# Patient Record
Sex: Female | Born: 1955 | Race: White | Hispanic: No | Marital: Married | Smoking: Current every day smoker
Health system: Southern US, Community
[De-identification: ages and names within clinical notes are randomized; demographics above are authoritative.]

## PROBLEM LIST (undated history)

## (undated) VITALS — BP 97/74 | HR 96 | Temp 97.5°F | Resp 16 | Ht 65.72 in | Wt 167.0 lb

## (undated) DIAGNOSIS — I350 Nonrheumatic aortic (valve) stenosis: Secondary | ICD-10-CM

## (undated) DIAGNOSIS — F32A Depression, unspecified: Secondary | ICD-10-CM

## (undated) DIAGNOSIS — J45909 Unspecified asthma, uncomplicated: Secondary | ICD-10-CM

## (undated) DIAGNOSIS — R519 Headache, unspecified: Secondary | ICD-10-CM

## (undated) DIAGNOSIS — D649 Anemia, unspecified: Secondary | ICD-10-CM

## (undated) DIAGNOSIS — K219 Gastro-esophageal reflux disease without esophagitis: Secondary | ICD-10-CM

## (undated) DIAGNOSIS — F1020 Alcohol dependence, uncomplicated: Secondary | ICD-10-CM

## (undated) DIAGNOSIS — I10 Essential (primary) hypertension: Secondary | ICD-10-CM

## (undated) DIAGNOSIS — F329 Major depressive disorder, single episode, unspecified: Secondary | ICD-10-CM

## (undated) DIAGNOSIS — F419 Anxiety disorder, unspecified: Secondary | ICD-10-CM

## (undated) DIAGNOSIS — R011 Cardiac murmur, unspecified: Secondary | ICD-10-CM

## (undated) DIAGNOSIS — M1990 Unspecified osteoarthritis, unspecified site: Secondary | ICD-10-CM

## (undated) HISTORY — DX: Unspecified osteoarthritis, unspecified site: M19.90

## (undated) HISTORY — PX: KNEE ARTHROSCOPY: SUR90

## (undated) HISTORY — PX: LAPAROSCOPIC HYSTERECTOMY: SHX1926

## (undated) HISTORY — DX: Unspecified asthma, uncomplicated: J45.909

## (undated) HISTORY — PX: GASTRIC BYPASS: SHX52

---

## 2001-07-30 ENCOUNTER — Encounter: Payer: Self-pay | Admitting: Surgery

## 2001-07-30 ENCOUNTER — Encounter: Admission: RE | Admit: 2001-07-30 | Discharge: 2001-07-30 | Payer: Self-pay | Admitting: Surgery

## 2001-08-03 ENCOUNTER — Encounter (INDEPENDENT_AMBULATORY_CARE_PROVIDER_SITE_OTHER): Payer: Self-pay | Admitting: Specialist

## 2001-08-03 ENCOUNTER — Ambulatory Visit (HOSPITAL_BASED_OUTPATIENT_CLINIC_OR_DEPARTMENT_OTHER): Admission: RE | Admit: 2001-08-03 | Discharge: 2001-08-04 | Payer: Self-pay | Admitting: Surgery

## 2002-11-02 ENCOUNTER — Encounter (INDEPENDENT_AMBULATORY_CARE_PROVIDER_SITE_OTHER): Payer: Self-pay | Admitting: *Deleted

## 2002-11-02 ENCOUNTER — Ambulatory Visit (HOSPITAL_COMMUNITY): Admission: RE | Admit: 2002-11-02 | Discharge: 2002-11-02 | Payer: Self-pay | Admitting: Obstetrics and Gynecology

## 2003-02-17 ENCOUNTER — Inpatient Hospital Stay (HOSPITAL_COMMUNITY): Admission: RE | Admit: 2003-02-17 | Discharge: 2003-02-19 | Payer: Self-pay | Admitting: Obstetrics and Gynecology

## 2003-02-17 ENCOUNTER — Encounter (INDEPENDENT_AMBULATORY_CARE_PROVIDER_SITE_OTHER): Payer: Self-pay | Admitting: Specialist

## 2003-12-05 ENCOUNTER — Other Ambulatory Visit: Admission: RE | Admit: 2003-12-05 | Discharge: 2003-12-05 | Payer: Self-pay | Admitting: Obstetrics and Gynecology

## 2004-01-10 ENCOUNTER — Emergency Department (HOSPITAL_COMMUNITY): Admission: EM | Admit: 2004-01-10 | Discharge: 2004-01-10 | Payer: Self-pay | Admitting: Emergency Medicine

## 2004-08-14 ENCOUNTER — Ambulatory Visit: Payer: Self-pay | Admitting: Internal Medicine

## 2004-09-13 ENCOUNTER — Ambulatory Visit: Payer: Self-pay | Admitting: Internal Medicine

## 2005-01-11 ENCOUNTER — Ambulatory Visit: Payer: Self-pay | Admitting: Internal Medicine

## 2005-03-28 ENCOUNTER — Inpatient Hospital Stay (HOSPITAL_COMMUNITY): Admission: AD | Admit: 2005-03-28 | Discharge: 2005-03-28 | Payer: Self-pay | Admitting: Neurology

## 2005-05-14 ENCOUNTER — Ambulatory Visit: Payer: Self-pay | Admitting: Internal Medicine

## 2005-07-26 ENCOUNTER — Encounter: Admission: RE | Admit: 2005-07-26 | Discharge: 2005-07-26 | Payer: Self-pay | Admitting: Family Medicine

## 2005-11-12 ENCOUNTER — Ambulatory Visit: Payer: Self-pay | Admitting: Internal Medicine

## 2006-08-05 ENCOUNTER — Inpatient Hospital Stay (HOSPITAL_COMMUNITY): Admission: EM | Admit: 2006-08-05 | Discharge: 2006-08-06 | Payer: Self-pay | Admitting: Emergency Medicine

## 2007-05-05 DIAGNOSIS — G4700 Insomnia, unspecified: Secondary | ICD-10-CM

## 2007-05-05 DIAGNOSIS — J209 Acute bronchitis, unspecified: Secondary | ICD-10-CM

## 2007-05-05 DIAGNOSIS — K219 Gastro-esophageal reflux disease without esophagitis: Secondary | ICD-10-CM

## 2007-05-05 DIAGNOSIS — J45909 Unspecified asthma, uncomplicated: Secondary | ICD-10-CM | POA: Insufficient documentation

## 2007-05-05 DIAGNOSIS — J309 Allergic rhinitis, unspecified: Secondary | ICD-10-CM | POA: Insufficient documentation

## 2007-05-06 ENCOUNTER — Ambulatory Visit: Payer: Self-pay | Admitting: Internal Medicine

## 2007-05-07 ENCOUNTER — Telehealth (INDEPENDENT_AMBULATORY_CARE_PROVIDER_SITE_OTHER): Payer: Self-pay | Admitting: *Deleted

## 2007-08-19 ENCOUNTER — Ambulatory Visit: Payer: Self-pay | Admitting: Internal Medicine

## 2007-12-07 ENCOUNTER — Encounter: Payer: Self-pay | Admitting: Internal Medicine

## 2007-12-07 ENCOUNTER — Telehealth (INDEPENDENT_AMBULATORY_CARE_PROVIDER_SITE_OTHER): Payer: Self-pay | Admitting: *Deleted

## 2007-12-15 ENCOUNTER — Ambulatory Visit: Payer: Self-pay | Admitting: Pulmonary Disease

## 2008-06-10 ENCOUNTER — Ambulatory Visit: Payer: Self-pay | Admitting: Internal Medicine

## 2008-07-07 ENCOUNTER — Encounter: Admission: RE | Admit: 2008-07-07 | Discharge: 2008-07-07 | Payer: Self-pay | Admitting: Surgery

## 2008-07-21 ENCOUNTER — Ambulatory Visit (HOSPITAL_COMMUNITY): Admission: RE | Admit: 2008-07-21 | Discharge: 2008-07-21 | Payer: Self-pay | Admitting: Surgery

## 2008-07-25 ENCOUNTER — Ambulatory Visit (HOSPITAL_COMMUNITY): Admission: RE | Admit: 2008-07-25 | Discharge: 2008-07-25 | Payer: Self-pay | Admitting: Obstetrics and Gynecology

## 2008-10-13 ENCOUNTER — Encounter (HOSPITAL_COMMUNITY): Admission: RE | Admit: 2008-10-13 | Discharge: 2008-12-08 | Payer: Self-pay | Admitting: Interventional Cardiology

## 2008-11-10 ENCOUNTER — Encounter: Admission: RE | Admit: 2008-11-10 | Discharge: 2009-02-08 | Payer: Self-pay | Admitting: Surgery

## 2008-11-16 ENCOUNTER — Encounter: Payer: Self-pay | Admitting: Internal Medicine

## 2008-11-29 ENCOUNTER — Inpatient Hospital Stay (HOSPITAL_COMMUNITY): Admission: RE | Admit: 2008-11-29 | Discharge: 2008-12-02 | Payer: Self-pay | Admitting: Surgery

## 2008-11-30 ENCOUNTER — Ambulatory Visit: Payer: Self-pay | Admitting: Vascular Surgery

## 2008-11-30 ENCOUNTER — Encounter (INDEPENDENT_AMBULATORY_CARE_PROVIDER_SITE_OTHER): Payer: Self-pay | Admitting: Surgery

## 2008-12-21 ENCOUNTER — Encounter: Payer: Self-pay | Admitting: Internal Medicine

## 2009-06-09 ENCOUNTER — Ambulatory Visit: Payer: Self-pay | Admitting: Internal Medicine

## 2009-06-28 ENCOUNTER — Ambulatory Visit (HOSPITAL_COMMUNITY): Admission: RE | Admit: 2009-06-28 | Discharge: 2009-06-28 | Payer: Self-pay | Admitting: Family Medicine

## 2009-11-30 ENCOUNTER — Ambulatory Visit: Payer: Self-pay | Admitting: Internal Medicine

## 2009-11-30 DIAGNOSIS — R0789 Other chest pain: Secondary | ICD-10-CM | POA: Insufficient documentation

## 2010-05-08 NOTE — Assessment & Plan Note (Signed)
Summary: 12 months/apc   Visit Type:  12 month visit Primary Provider/Referring Provider:  Dawayne Patricia  CC:  no complaints.  History of Present Illness: 08/19/07- she asked to be worked in for an acute visit today.  She kept her grandson and caught a cold.  Green nasal discharge.  Has not felt well.  Chest feels tight.  Head is congested, and she's wheezing.  Feels hot but has not had fever or sore throat. 12/15/07- 55 year old female with known history of asthma, GERD and Diabetes.  December 17, 2007 - Complains over last 10 days of cough, congestion, thick mucus, wheeizng and nasal congestion. Denies chest pain, dyspnea, orthopnea, hemoptysis, fever, n/v/d, edema.  Took some left over amoxicillin for  days. not much help.   06/10/08- Asthma, allergic rhinitis Aware lisinopril might cause cough- not having a problem. She is considering bariatric surgery. No active respiratory difficulty. Reviewed meds.  June 09, 2009- Asthma, allergic rhinitis, Bariatric surgery 2010 Continues weight loss after gastric bypass bariatric surgery 11/29/08. More comfortable. Had a minor cold but resolving with otc meds. Had flu vax.     Current Medications (verified): 1)  Prilosec 20 Mg  Cpdr (Omeprazole) .... Take 1 Capsule By Mouth Once A Day 2)  Lisinopril 20 Mg Tabs (Lisinopril) .... Once Daily 3)  Aleve 220 Mg  Tabs (Naproxen Sodium) .... Taking Up To 6 Per Day 4)  Prozac 40 Mg  Caps (Fluoxetine Hcl) .... Take 1 Capsule By Mouth Once A Day 5)  Advair Diskus 100-50 Mcg/dose  Misc (Fluticasone-Salmeterol) .... Inhale 1 Puff Two Times A Day 6)  Ventolin Hfa 108 (90 Base) Mcg/act Aers (Albuterol Sulfate) .... 2 Puffs Four Times A Day As Needed 7)  Crestor 10 Mg Tabs (Rosuvastatin Calcium) .Marland Kitchen.. 1 Daily  Allergies (verified): 1)  ! 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 Married Patient states former smoker x 25 years  Review of Systems      See HPI  The patient denies anorexia, fever, weight loss, weight gain, vision loss, decreased hearing, hoarseness, chest pain, syncope, dyspnea on exertion, peripheral edema, prolonged cough, headaches, hemoptysis, abdominal pain, and severe indigestion/heartburn.    Vital Signs:  Patient profile:   55 year old female Height:      56 inches Weight:      192.25 pounds BMI:     43.26 O2 Sat:      86 % on Room air Pulse rate:   71 / minute BP sitting:   110 / 68  (left arm) Cuff size:   regular  Vitals Entered By: Clarise Cruz Duncan Dull) (June 09, 2009 10:11 AM)  O2 Flow:  Room air  Physical Exam  Additional Exam:  General: A/Ox3; pleasant and cooperative, NAD, talkative and energetic, very obese SKIN: no rash, lesions NODES: no lymphadenopathy HEENT: [place]/AT, EOM- WNL, Conjuctivae- clear, PERRLA,  TM-WNL, Nose- clear, Throat- clear and wnl, Mallampati  II NECK: Supple w/ fair ROM, JVD- none, normal carotid impulses w/o bruits Thyroid-  CHEST: Clear to P&A, mild inspiratory drag HEART: RRR, no m/g/r heard ABDOMEN:  WUJ:WJXB, nl pulses, no edema  NEURO: Grossly intact to observation      Impression & Recommendations:  Problem # 1:  ASTHMA (ICD-493.90) Excellent control. Weight loss has helped.  Problem # 2:  ALLERGIC  RHINITIS (ICD-477.9)  Good control.  Medications Added to Medication List This Visit: 1)  Lisinopril 20 Mg Tabs (Lisinopril) .... Once daily  Other Orders: Est. Patient Level III (14782)  Patient Instructions: 1)  Schedule return in one year, earlier if needed Prescriptions: VENTOLIN HFA 108 (90 BASE) MCG/ACT AERS (ALBUTEROL SULFATE) 2 puffs four times a day as needed  #3 x 3   Entered and Authorized by:   Waymon Budge MD   Signed by:   Waymon Budge MD on 06/09/2009   Method used:   Print then Give to Patient   RxID:   [id number] ADVAIR DISKUS 100-50 MCG/DOSE  MISC (FLUTICASONE-SALMETEROL) Inhale 1 puff two times a day  #3 x 3   Entered and Authorized by:   Waymon Budge MD   Signed by:   Waymon Budge MD on 06/09/2009   Method used:   Print then Give to Patient   RxID:   [id number]

## 2010-05-08 NOTE — Assessment & Plan Note (Signed)
Summary: rov/ mbw   Primary Provider/Referring Provider:  Dawayne Patricia  CC:  Folloew up visit-recent Bronchitis-not gotten any better-pain when inhaling.Marland Kitchen  History of Present Illness:  History of Present Illness: 08/19/07- she asked to be worked in for an acute visit today.  She kept her grandson and caught a cold.  Green nasal discharge.  Has not felt well.  Chest feels tight.  Head is congested, and she's wheezing.  Feels hot but has not had fever or sore throat. 12/15/07- 55 year old female with known history of asthma, GERD and Diabetes.  December 17, 2007 - Complains over last 10 days of cough, congestion, thick mucus, wheeizng and nasal congestion. Denies chest pain, dyspnea, orthopnea, hemoptysis, fever, n/v/d, edema.  Took some left over amoxicillin for  days. not much help.   06/10/08- Asthma, allergic rhinitis Aware lisinopril might cause cough- not having a problem. She is considering bariatric surgery. No active respiratory difficulty. Reviewed meds.  June 09, 2009- Asthma, allergic rhinitis, Bariatric surgery 2010 Continues weight loss after gastric bypass bariatric surgery 11/29/08. More comfortable. Had a minor cold but resolving with otc meds. Had flu vax.   November 30, 2009- Asthma, allergic rhinits, Bariatric surgery2010 Had a cold end of July, with chills. Went to her primary for scheduled PHEX August 3. Was dx'd bronchitis and given doxy and pred. Sputum was green. Didn't cpmletely clear. Coughing still with light green sputum. For past week has had a pleuritic right upper parasternal chest pain. Using rescue inhaler a lot, but just because she has it - it isn't doing much for her.   Asthma History    Initial Asthma Severity Rating:    Age range: 12+ years    Symptoms: 0-2 days/week    Nighttime Awakenings: 0-2/month    Interferes w/ normal activity: no limitations    SABA use (not for EIB): several times per day    Asthma Severity Assessment: Severe  Persistent   Preventive Screening-Counseling & Management  Alcohol-Tobacco     Smoking Status: quit     Year Quit: 1999     Pack years: socially  Current Medications (verified): 1)  Prilosec 20 Mg  Cpdr (Omeprazole) .... Take 1 Capsule By Mouth Once A Day 2)  Lisinopril 20 Mg Tabs (Lisinopril) .... Once Daily 3)  Aleve 220 Mg  Tabs (Naproxen Sodium) .... Taking Up To 6 Per Day 4)  Prozac 40 Mg  Caps (Fluoxetine Hcl) .... Take 1 Capsule By Mouth Once A Day 5)  Advair Diskus 100-50 Mcg/dose  Misc (Fluticasone-Salmeterol) .... Inhale 1 Puff Two Times A Day 6)  Ventolin Hfa 108 (90 Base) Mcg/act Aers (Albuterol Sulfate) .... 2 Puffs Four Times A Day As Needed 7)  Crestor 10 Mg Tabs (Rosuvastatin Calcium) .Marland Kitchen.. 1 Daily  Allergies: 1)  ! Morphine 2)  ! 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  The patient denies shortness of breath with activity, shortness of breath at rest, coughing up blood, irregular heartbeats, acid heartburn, indigestion, loss of appetite, weight change, abdominal pain, difficulty swallowing, sore throat, tooth/dental problems, headaches, nasal congestion/difficulty breathing through nose, and sneezing.    Vital Signs:  Patient profile:   55 year old  female Height:      56 inches Weight:      185.50 pounds BMI:     41.74 O2 Sat:      97 % on Room air Pulse rate:   80 / minute BP sitting:   110 / 74  (left arm) Cuff size:   regular  Vitals Entered By: Reynaldo Minium CMA (November 30, 2009 9:22 AM)  O2 Flow:  Room air CC: Folloew up visit-recent Bronchitis-not gotten any better-pain when inhaling.   Physical Exam  Additional Exam:  General: A/Ox3; pleasant and cooperative, NAD, talkative and energetic,  obese but has lost weight SKIN: no rash, lesions NODES: no lymphadenopathy HEENT: [place]/AT, EOM- WNL, Conjuctivae- clear, PERRLA, TM-WNL, Nose- clear, Throat- clear and wnl, Mallampati  II NECK: Supple w/ fair ROM, JVD- none, normal carotid impulses w/o bruits Thyroid-  CHEST: Wheeze bilaterally HEART: RRR, no m/g/r heard ABDOMEN:  LKG:MWNU, nl pulses, no edema  NEURO: Grossly intact to observation      Impression & Recommendations:  Problem # 1:  ASTHMA (ICD-493.90) Exacerbation of an asthnmatic bronchitis that began as an infection.  Now has musculoskeletal chest wall pain  c/w costochondiritis. We will get CXR because of the persistent cpough, but re-treat with pred and antibiotic, neb and depo.  Problem # 2:  CHEST PAIN, ATYPICAL (ICD-786.59) Discussion as above.  Medications Added to Medication List This Visit: 1)  Amoxicillin-pot Clavulanate 500-125 Mg Tabs (Amoxicillin-pot clavulanate) .Marland Kitchen.. 1 two times a day 2)  Prednisone 10 Mg Tabs (Prednisone) .Marland Kitchen.. 1 tab four times daily x 2 days, 3 times daily x 2 days, 2 times daily x 2 days, 1 time daily x 2 days  Other Orders: Est. Patient Level III (99213) T-2 View CXR (71020TC) Nebulizer Tx (27253) Depo- Medrol 80mg  (J1040) Admin of Therapeutic Inj  intramuscular or subcutaneous (66440)  Patient Instructions: 1)  Please schedule a follow-up appointment in 3 months. 2)  Neb xop 1.25 3)  depo80 4)  Scripts for antibiotic and prednisone taper sent to drug store 5)  A  chest x-ray has been recommended.  Your imaging study may require preauthorization.  Prescriptions: PREDNISONE 10 MG TABS (PREDNISONE) 1 tab four times daily x 2 days, 3 times daily x 2 days, 2 times daily x 2 days, 1 time daily x 2 days  #20 x 0   Entered and Authorized by:   Waymon Budge MD   Signed by:   Waymon Budge MD on 11/30/2009   Method used:   Electronically to        [facility] [address] (retail)       [address]       [place], [place]  [address]       Ph: [phone] or [phone]       Fax: [phone]   RxID:   [id number] AMOXICILLIN-POT CLAVULANATE 500-125 MG TABS (AMOXICILLIN-POT CLAVULANATE) 1 two times a day  #14 x 0   Entered and Authorized by:   Waymon Budge MD   Signed by:   Joni Fears  Magda Kiel MD on 11/30/2009   Method used:   Electronically to        [facility] [address] (retail)       [address]       [place], [place]  [address]       Ph: [phone] or [phone]       Fax: [phone]   RxID:   [id number]      Medication Administration  Injection # 1:    Medication: Depo- Medrol 80mg     Diagnosis: ASTHMA (ICD-493.90)    Route: IM    Site: LUOQ gluteus    Exp Date: 07-2012    Lot #: [id number]    Mfr: Pharmacia    Patient tolerated injection without complications    Given by: Elray Buba RN (November 30, 2009 10:28 AM)  Medication # 1:    Medication: Xopenex 1.25mg     Diagnosis: ASTHMA (ICD-493.90)    Dose: 1 VIAL    Route: inhaled    Exp Date: 09-11    Lot #: [id number]    Mfr: SEPRACOR    Patient tolerated medication without complications    Given by: Elray Buba RN (November 30, 2009 10:26 AM)  Orders Added: 1)  Est. Patient Level III [15176] 2)  T-2 View CXR [71020TC] 3)  Nebulizer Tx [16073] 4)  Depo- Medrol 80mg  [J1040] 5)  Admin of Therapeutic Inj  intramuscular or subcutaneous [71062]

## 2010-06-08 ENCOUNTER — Ambulatory Visit (INDEPENDENT_AMBULATORY_CARE_PROVIDER_SITE_OTHER): Payer: 59 | Admitting: Internal Medicine

## 2010-06-08 ENCOUNTER — Encounter: Payer: Self-pay | Admitting: Internal Medicine

## 2010-06-08 DIAGNOSIS — J45909 Unspecified asthma, uncomplicated: Secondary | ICD-10-CM

## 2010-06-08 DIAGNOSIS — J309 Allergic rhinitis, unspecified: Secondary | ICD-10-CM

## 2010-06-11 ENCOUNTER — Other Ambulatory Visit (HOSPITAL_COMMUNITY): Payer: Self-pay | Admitting: Family Medicine

## 2010-06-11 DIAGNOSIS — Z139 Encounter for screening, unspecified: Secondary | ICD-10-CM

## 2010-06-19 NOTE — Assessment & Plan Note (Signed)
Summary: 1 year follow up   Primary Provider/Referring Provider:  Dawayne Patricia  CC:  Follow up visit-asthma and allergies..  History of Present Illness: June 09, 2009- Asthma, allergic rhinitis, Bariatric surgery 2010 Continues weight loss after gastric bypass bariatric surgery 11/29/08. More comfortable. Had a minor cold but resolving with otc meds. Had flu vax.   November 30, 2009- Asthma, allergic rhinits, Bariatric surgery2010 Had a cold end of July, with chills. Went to her primary for scheduled PHEX August 3. Was dx'd bronchitis and given doxy and pred. Sputum was green. Didn't cpmletely clear. Coughing still with light green sputum. For past week has had a pleuritic right upper parasternal chest pain. Using rescue inhaler a lot, but just because she has it - it isn't doing much for her.  June 08, 2010- Asthma, allergic rhinits, Bariatric surgery2010 Follow up visit-asthma and allergies. Had a bronchits mostly resolved with otc meds. Residual chest congetion, non productive. Dyspnea is apropriate to level of exertion. Breathing doesn' t wake her. Advair two times a day. Uses ventolin once daily "habit rather than need". Expects to be worst in Spring and Fall.    Asthma History    Asthma Control Assessment:    Age range: 12+ years    Symptoms: 0-2 days/week    Nighttime Awakenings: 0-2/month    Interferes w/ normal activity: no limitations    SABA use (not for EIB): 0-2 days/week    Asthma Control Assessment: Well Controlled   Preventive Screening-Counseling & Management  Alcohol-Tobacco     Smoking Status: quit     Year Quit: 1999     Pack years: socially  Current Medications (verified): 1)  Prilosec 20 Mg  Cpdr (Omeprazole) .... Take 1 Capsule By Mouth Once A Day 2)  Lisinopril 20 Mg Tabs (Lisinopril) .... Once Daily 3)  Aleve 220 Mg  Tabs (Naproxen Sodium) .... Taking Up To 6 Per Day 4)  Prozac 40 Mg  Caps (Fluoxetine Hcl) .... Take 1 Capsule By Mouth Once A Day 5)   Advair Diskus 100-50 Mcg/dose  Misc (Fluticasone-Salmeterol) .... Inhale 1 Puff Two Times A Day 6)  Ventolin Hfa 108 (90 Base) Mcg/act Aers (Albuterol Sulfate) .... 2 Puffs Four Times A Day As Needed 7)  Crestor 10 Mg Tabs (Rosuvastatin Calcium) .Marland Kitchen.. 1 Daily 8)  Vitamin D3 2000 Unit Caps (Cholecalciferol) .... Take 1 Poq D 9)  Ergocalcium 100,000 .... Take Twice Weekly  Allergies (verified): 1)  ! Morphine 2)  ! 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  The patient denies shortness of breath at rest, productive cough, non-productive cough, coughing up blood, chest pain, irregular heartbeats, acid heartburn, indigestion, loss of appetite, weight change, abdominal pain, difficulty swallowing, sore throat, tooth/dental problems, headaches, nasal congestion/difficulty breathing through nose, and sneezing.    Vital Signs:  Patient profile:   55 year old female Height:      56 inches Weight:      184 pounds BMI:     41.40 O2 Sat:      94 % on Room air Pulse rate:   79 / minute BP sitting:   138 / 80  (left arm) Cuff size:   regular  Vitals Entered By: Randell Loop CMA (June 08, 2010 9:57 AM)  O2 Flow:  Room air CC: Follow up visit-asthma and allergies.   Physical  Exam  Additional Exam:  General: A/Ox3; pleasant and cooperative, NAD, talkative and energetic,  obese but has lost weight SKIN: no rash, lesions NODES: no lymphadenopathy HEENT: [place]/AT, EOM- WNL, Conjuctivae- clear, PERRLA, TM-WNL, Nose- clear, Throat- clear and wnl, Mallampati  II NECK: Supple w/ fair ROM, JVD- none, normal carotid impulses w/o bruits Thyroid-  CHEST: Wheeze bilaterally, especially left upper zone.  HEART: RRR, no m/g/r heard ABDOMEN:  ZOX:WRUE, nl pulses, no edema  NEURO: Grossly intact to observation      Impression & Recommendations:  Problem # 1:  ASTHMA (ICD-493.90) Insufficient control. Part of the problem is that she chooses to ignore symptoms. Education done.   Plan- we will move back up to Advair 250 to see if this will stabilize her going into bad season.   Problem # 2:  ESOPHAGEAL REFLUX (ICD-530.81) We reviewed status, with discussion of interaction between reflux and asthma symptoms as a possibility.  Her updated medication list for this problem includes:    Prilosec 20 Mg Cpdr (Omeprazole) .Marland Kitchen... Take 1 capsule by mouth once a day  Problem # 3:  ALLERGIC RHINITIS (ICD-477.9) not yet notincing much discomfort with early pollen season.   Medications Added to Medication List This Visit: 1)  Advair Diskus 250-50 Mcg/dose Aepb (Fluticasone-salmeterol) .Marland Kitchen.. 1 puff and rise two times a day 2)  Vitamin D3 2000 Unit Caps (Cholecalciferol) .... Take 1 poq d 3)  Ergocalcium 100,000  .... Take twice weekly  Other Orders: Est. Patient Level III (45409)  Patient Instructions: 1)  Please schedule a follow-up appointment in 6 months. 2)  Script so you can go up on Advair for the Spring to Advair 250. We can reassess as needed.  Prescriptions: ADVAIR DISKUS 250-50 MCG/DOSE AEPB (FLUTICASONE-SALMETEROL) 1 puff and rise two times a day  #1 x prn    Entered and Authorized by:   Waymon Budge MD   Signed by:   Waymon Budge MD on 06/08/2010   Method used:    Print then Give to Patient   RxID:   [id number]     Appended Document: 1 year follow up-SEND RX TO [facility] Pt requested Rx for Advair 250/50 be sent to [facility]. I have sent Rx as requested.Reynaldo Minium CMA  June 12, 2010 5:13 PM    Clinical Lists Changes  Medications: Rx of ADVAIR DISKUS 250-50 MCG/DOSE AEPB (FLUTICASONE-SALMETEROL) 1 puff and rise two times a day;  #3 x 3;  Signed;  Entered by: Reynaldo Minium CMA;  Authorized by: Waymon Budge MD;  Method used: Electronically to [facility] [address], [place], [place]  [address], Ph: [phone], Fax: [phone]    Prescriptions: ADVAIR DISKUS 250-50 MCG/DOSE AEPB (FLUTICASONE-SALMETEROL) 1 puff and rise two times a day  #3 x 3   Entered by:   Reynaldo Minium CMA   Authorized by:   Waymon Budge MD   Signed by:  Reynaldo Minium CMA on 06/12/2010   Method used:   Electronically to        [facility]* (mail-order)       [address]       [place], [place]  [address]       Ph: [phone]       Fax: [phone]   RxID:   [id number]

## 2010-07-14 LAB — DIFFERENTIAL
Basophils Absolute: 0.1 10*3/uL (ref 0.0–0.1)
Basophils Relative: 1 % (ref 0–1)
Basophils Relative: 1 % (ref 0–1)
Eosinophils Absolute: 0 10*3/uL (ref 0.0–0.7)
Eosinophils Absolute: 0.1 10*3/uL (ref 0.0–0.7)
Eosinophils Absolute: 0.1 10*3/uL (ref 0.0–0.7)
Eosinophils Relative: 0 % (ref 0–5)
Eosinophils Relative: 1 % (ref 0–5)
Eosinophils Relative: 2 % (ref 0–5)
Lymphocytes Relative: 10 % — ABNORMAL LOW (ref 12–46)
Lymphocytes Relative: 13 % (ref 12–46)
Lymphocytes Relative: 20 % (ref 12–46)
Lymphs Abs: 1.1 10*3/uL (ref 0.7–4.0)
Lymphs Abs: 1.1 10*3/uL (ref 0.7–4.0)
Monocytes Absolute: 0.3 10*3/uL (ref 0.1–1.0)
Monocytes Absolute: 0.6 10*3/uL (ref 0.1–1.0)
Monocytes Relative: 6 % (ref 3–12)
Monocytes Relative: 7 % (ref 3–12)
Monocytes Relative: 8 % (ref 3–12)
Neutro Abs: 3.9 10*3/uL (ref 1.7–7.7)
Neutro Abs: 6.4 10*3/uL (ref 1.7–7.7)

## 2010-07-14 LAB — COMPREHENSIVE METABOLIC PANEL
AST: 27 U/L (ref 0–37)
Albumin: 4.1 g/dL (ref 3.5–5.2)
BUN: 15 mg/dL (ref 6–23)
Calcium: 9.6 mg/dL (ref 8.4–10.5)
Chloride: 102 mEq/L (ref 96–112)
Creatinine, Ser: 0.6 mg/dL (ref 0.4–1.2)
GFR calc Af Amer: >60 mL/min (ref >60)
Total Bilirubin: 0.5 mg/dL (ref 0.3–1.2)
Total Protein: 7.1 g/dL (ref 6.0–8.3)

## 2010-07-14 LAB — CBC
HCT: 33 % — ABNORMAL LOW (ref 36.0–46.0)
HCT: 33.9 % — ABNORMAL LOW (ref 36.0–46.0)
Hemoglobin: 11 g/dL — ABNORMAL LOW (ref 12.0–15.0)
MCHC: 34.7 g/dL (ref 30.0–36.0)
MCHC: 34.9 g/dL (ref 30.0–36.0)
MCV: 91.2 fL (ref 78.0–100.0)
MCV: 92 fL (ref 78.0–100.0)
MCV: 92 fL (ref 78.0–100.0)
Platelets: 123 10*3/uL — ABNORMAL LOW (ref 150–400)
Platelets: 127 10*3/uL — ABNORMAL LOW (ref 150–400)
Platelets: 161 10*3/uL (ref 150–400)
RBC: 3.44 MIL/uL — ABNORMAL LOW (ref 3.87–5.11)
RBC: 3.72 MIL/uL — ABNORMAL LOW (ref 3.87–5.11)
RDW: 14.2 % (ref 11.5–15.5)
RDW: 14.5 % (ref 11.5–15.5)
WBC: 5.4 10*3/uL (ref 4.0–10.5)
WBC: 8.4 10*3/uL (ref 4.0–10.5)

## 2010-07-14 LAB — HEMOGLOBIN AND HEMATOCRIT, BLOOD
HCT: 34.1 % — ABNORMAL LOW (ref 36.0–46.0)
Hemoglobin: 11.6 g/dL — ABNORMAL LOW (ref 12.0–15.0)

## 2010-07-14 LAB — GLUCOSE, CAPILLARY
Glucose-Capillary: 107 mg/dL — ABNORMAL HIGH (ref 70–99)
Glucose-Capillary: 107 mg/dL — ABNORMAL HIGH (ref 70–99)
Glucose-Capillary: 109 mg/dL — ABNORMAL HIGH (ref 70–99)
Glucose-Capillary: 111 mg/dL — ABNORMAL HIGH (ref 70–99)
Glucose-Capillary: 111 mg/dL — ABNORMAL HIGH (ref 70–99)
Glucose-Capillary: 117 mg/dL — ABNORMAL HIGH (ref 70–99)
Glucose-Capillary: 120 mg/dL — ABNORMAL HIGH (ref 70–99)
Glucose-Capillary: 132 mg/dL — ABNORMAL HIGH (ref 70–99)
Glucose-Capillary: 138 mg/dL — ABNORMAL HIGH (ref 70–99)
Glucose-Capillary: 154 mg/dL — ABNORMAL HIGH (ref 70–99)
Glucose-Capillary: 163 mg/dL — ABNORMAL HIGH (ref 70–99)
Glucose-Capillary: 189 mg/dL — ABNORMAL HIGH (ref 70–99)
Glucose-Capillary: 92 mg/dL (ref 70–99)

## 2010-07-26 ENCOUNTER — Ambulatory Visit (HOSPITAL_COMMUNITY): Payer: 59

## 2010-08-21 NOTE — Discharge Summary (Signed)
[name], [name]            ACCOUNT NO.:  [id number]   MEDICAL RECORD NO.:  [id number]          PATIENT TYPE:  INP   LOCATION:  1527                         FACILITY:  [facility]   PHYSICIAN:  Sandria Bales. Ezzard Standing, M.D.  DATE OF BIRTH:  [birth year]   DATE OF ADMISSION:  11/29/2008  DATE OF DISCHARGE:  12/02/2008                               DISCHARGE SUMMARY   Dates of admission/discharge?   DISCHARGE DIAGNOSES:  1. Morbid obesity with a weight of 253, BMI of 40.7.  2. Hypertension.  3. Hypercholesterolemia.  4. Non-insulin-dependent diabetes mellitus.  5. Seasonal asthma.  6. History of gastroesophageal reflux disease.  7. Depression, on Prozac.  8. Osteoarthritis of knees.  9. Transient thrombocytopenia, resolved.   OPERATIONS PERFORMED:  The patient had a laparoscopic Roux-en-Y gastric  bypass and upper endoscopy on November 29, 2008.   HISTORY OF ILLNESS:  [name] is a 55 year old white female who sees  Dr. Gweneth Dimitri, as her primary care doctor, and Dr. Jetty Duhamel  from a pulmonary standpoint, has been overweight much of her adult life  and tried multiple diets without success.  She has been through our  bariatric program and comes to [facility] with plans for a  Roux-en-Y gastric bypass.   SIGNIFICANT PAST HISTORY:  1. She has hypertension.  2. She has hypercholesterolemia.  3. She has seasonal asthma.  4. She has gastroesophageal reflux disease.  5. She has a history of depression, on Prozac.  6. She has osteoarthritis, takes nonsteroidal antiinflammatory drugs.      A long discussion was carried out about the risk of these postop      bypass and she understands she may need to modify the use of      nonsteroidals for her arthritis postop.  7. She has non-insulin-dependent diabetes mellitus, on oral      hypoglycemics.  8. Prior history of a umbilical hernia per Dr. Gerrit Friends in 2003 with      mesh.   On the day of admission, she was taken to the  operating room where she  underwent a laparoscopic Roux-en-Y gastric bypass and upper endoscopy.  Postop she did well.   She was fairly sore on the first postoperative day, her hemoglobin was  12, white blood count of 8004 and platelets were 127,000.  Her blood  sugars were stable.  Dopplers of her lower extremities were negative and  her UGI swallow was okay.   On the second postoperative day, her white blood count was 8100,  platelets had dropped to 100,000.  She was on subcu heparin for DVT  prophylaxis, I left her on the subcu heparin despite the platelet  decrease.  She had a moderate amount of bruising around her incisions  but otherwise seemed to be doing well but she does seem to have some gas  discomfort on the second postop day and was not really ready to go home.   On the third day, she is afebrile.  Her platelets count improved to  123,000.  She is feeling better and ready for discharge.   DISCHARGE INSTRUCTIONS:  I  reviewed her medicines with her.  1. She is going to hold her diabetes medicines, her Glucophage and      Amaryl, and check her blood sugars and add these back as necessary.      I have also instructed her to get in touch with Dr. Gweneth Dimitri      to follow up her medicines within 2-4 weeks post discharge.  2. She will continue lisinopril.  3. She will continue Prozac 40 mg daily.  4. She will continue Crestor 20 mg daily.  5. Prilosec, she can decide to use or not depending on what kind of      symptoms she has.  6. She is on Xanax 0.25 mg p.r.n.  7. Advair Diskus 10/50 that she will use twice a day.  8. She has albuterol inhaler p.r.n.  9. She has Aleve, I have told her to hold until she is back in the      office again.  I warned her about the risk of ulceration with      nonsteroidal antiinflammatory drugs.   She is given Roxicet elixir for pain.  She is to see the dietician in 2  weeks.  She will be on her post Roux-en-Y gastric bypass which  includes a high  protein liquid diet and clear liquids, and then she will see me back in  2-3 weeks for follow up.  Her discharge condition is good.      Sandria Bales. Ezzard Standing, M.D.  Electronically Signed     DHN/MEDQ  D:  12/02/2008  T:  12/02/2008  Job:  [id number]   cc:   Joni Fears D. Maple Hudson, MD, FCCP, FACP  Pam Drown, M.D.

## 2010-08-21 NOTE — Op Note (Signed)
[name], [name]            ACCOUNT NO.:  [id number]   MEDICAL RECORD NO.:  [id number]          PATIENT TYPE:  INP   LOCATION:  1527                         FACILITY:  [facility]   PHYSICIAN:  Sharlet Salina T. Hoxworth, M.D.DATE OF BIRTH:  [birth year]   DATE OF PROCEDURE:  11/29/2008  DATE OF DISCHARGE:                               OPERATIVE REPORT   PROCEDURE:  Upper GI endoscopy.   DESCRIPTION OF PROCEDURE:  Upper GI endoscopy is performed at the  completion of Roux-en-Y gastric bypass by Dr. Ovidio Kin.  The video  endoscope was inserted into the upper esophagus and passed under direct  vision to the EG junction.  The small gastric pouch was entered and then  tensely distended with air.  With the outlet clamped by Dr. Ezzard Standing under  saline irrigation there was no evidence of leak.  The anastomosis was  visualized and was patent.  The suture and staple lines appeared intact  and without bleeding.  The gastric mucosa was normal.  The pouch  measured 5 cm in length.  Following this the pouch was desufflated and  the scope withdrawn.      Lorne Skeens. Hoxworth, M.D.  Electronically Signed     BTH/MEDQ  D:  11/29/2008  T:  11/29/2008  Job:  [id number]

## 2010-08-21 NOTE — Op Note (Signed)
[name], [name]            ACCOUNT NO.:  [id number]   MEDICAL RECORD NO.:  [id number]          PATIENT TYPE:  INP   LOCATION:  0002                         FACILITY:  [facility]   PHYSICIAN:  Sandria Bales. Ezzard Standing, M.D.  DATE OF BIRTH:  25-Mar-1956   DATE OF PROCEDURE:  11/29/2008  DATE OF DISCHARGE:                               OPERATIVE REPORT   Date of surgery ?   PREOPERATIVE DIAGNOSIS:  Morbid obesity, weight of 253, body mass index  of 40.7.   POSTOPERATIVE DIAGNOSIS:  Morbid obesity, weight of 253, body mass index  of 40.7.   PROCEDURE:  Laparoscopic Roux-en-Y gastric (antecolic, antegastric),  upper endoscopy.   SURGEON:  Dr.  Ezzard Standing.   FIRST ASSISTANT:  Dr. Jaclynn Guarneri.   ANESTHESIA:  General endotracheal.   ESTIMATED BLOOD LOSS:  Minimal.   HISTORY AND PROCEDURE:  [name] is a 55 year old white female who  has completed our bariatric preoperative program and is interested in  laparoscopic Roux-en-Y gastric bypass.   The indications, benefits and complications of procedure explained to  the patient.  Potential complications include, but are not limited to,  bleeding, infection, leakage from the bowel, deep venous thrombosis with  possible from pulmonary embolism, the possibility open surgery, and long-  term nutritional consequences.   OPERATIVE NOTE:  The patient was placed in a supine position and given a  general endotracheal anesthetic.  She was given 2 grams of cefoxitin at  the initiation of the procedure.  Her abdomen was prepped with CHG and  sterilely draped.  A time-out was held identifying the patient and  procedure and the surgical check list run.   I accessed the abdominal cavity through the left upper quadrant using a  11-mm Ethicon OptiView trocar and entered the abdominal cavity without  difficulty.   I then carried out laparoscopic exploration and the left lobe of the  liver was unremarkable.  The anterior wall of the stomach was  unremarkable.  The patient did have adhesions around her umbilical area  where she had a prior hernia repair.  There was no other mass or nodule  within her abdomen.  I ended up placing six additional trocars, a 5-mm  subxiphoid trocar for the liver retractor, a 12 mm right subcostal  trocar, a 12-mm right paramedian trocar, an 11 mm trocar to the right  lateral aspect of the umbilicus, a left paramedian trocar and then a 5  mm left lateral trocar.   I first took down the omentum.  This took only about 10 minutes to take  this down.  I then was able to mobilize and push the omentum up and  identified the ligament Treitz.  I then ran the jejunum about 40 cm and  identified the jejunum and divided that with  a 45-mm white load of the  Endo-GIA stapler.   I then marked the future gastric limb with a Penrose drain.  I then  counted 100 cm of jejunum and then did a side-to-side jejunojejunal  anastomosis with a white load of the Endo-GIA stapler.  I then closed  the enterotomy with  two running 2-0 Vicryl sutures.  I then probed the  enterotomy closure and there were no problems with this.  I then closed  the jejunal jejunojejunostomy mesenteric defect with a running 2-0 silk  suture.  I placed Tisseel over the jejunojejunostomy.   Because of the size of her omentum, I did divide her omentum up to the  transverse colon.  I then placed the patient in reverse Trendelenburg  position, placed a Mavenson liver retractor in the left lobe of liver,  and identified the gastroesophageal junction.  I opened the angle of  HISS to the left of the gastroesophageal junction.  I then went along  the lesser curvature try to about 5 cm beyond the gastroesophageal  junction and got into the lesser sac.   I then did use a blue load of the 45 across the stomach initially and  then I used three additional blue loads of the 60-mm Ethicon stapler to  divide the remainder of the stomach.   The stomach was  divided.  I reinspected the staple line which looked  good.  I oversewed the distal gastric remnant with a running locking 2-0  Vicryl suture with a Lapra tie on both ends.  The gastric pouch looked  good.  I then brought the jejunum antecolic,  antegastric and sewed the  gastric pouch and the bowel reached without much tension.  I did a  running 2-0 Vicryl suture for the posterior layer.  I then made an  enterotomy in the stomach, an enterotomy in the small bowel and did a  gastrojejunostomy using a 45 blue staple load.  This allowed adequate  opening into the anastomosis and then I closed the enterotomy with two  running 2-0 Vicryl sutures.   I then passed the Ewald tube down through the gastrojejunostomy and did  a second anterior layer on the stomach with a running 2-0 Vicryl suture.   I then removed the Ewald tube.  We then put a figure-of-eight suture to  try to attach the mesentery of the transverse colon to the mesentery of  the distal jejunum to close Peterson's defect.   At this time Dr. Johna Sheriff broke scrub the wound and he went up at the  head, then passed a Olympus endoscope without difficulty and passed this  down the esophagus into the stomach.  I clamped off the small bowel and  flooded the upper abdomen with saline while he did the endoscopy.  There  was no evidence of any air leak.  The mucosa of the stomach looked  viable and there was no bleeding.  He then withdrew the scope and will  dictate this portion of the operation.   I then irrigated with about a liter of saline.  I placed Tisseel along  the gastrojejunostomy and over the cut jejunostomy.   There was no bleeding.  The stoma and anastomosis looked viable.  The  Mavenson retractor was first removed the trocars removed and then the  trocars were removed in turn.  The skin edge site was closed with a 5-0  Vicryl suture and painted with the patient and Steri-Strips.   The patient tolerated the procedure well,  and was transported to the  recovery room in good condition.  Sponge and needle count were correct  at the end of the case.      Sandria Bales. Ezzard Standing, M.D.  Electronically Signed     DHN/MEDQ  D:  11/29/2008  T:  11/29/2008  Job:  [id number]   cc:   Pam Drown, M.D.  Fax: [phone]   Clinton D. Maple Hudson, MD, FCCP, FACP  [facility]-Pulmonary Dept  [address]  [place]  [place] [address]

## 2010-08-24 NOTE — H&P (Signed)
[name], [name]            ACCOUNT NO.:  [id number]   MEDICAL RECORD NO.:  [id number]          PATIENT TYPE:  INP   LOCATION:  A331                          FACILITY:  [facility]   PHYSICIAN:  Marcello Moores, MD   DATE OF BIRTH:  [birth year]   DATE OF ADMISSION:  08/05/2006  DATE OF DISCHARGE:  LH                              HISTORY & PHYSICAL   PRIMARY MEDICAL DOCTOR:  Unassigned.   CHIEF COMPLAINT:  Vomiting and chest discomfort for 2 days' duration.   HISTORY OF PRESENT ILLNESS:  [name] is a 55 year old female patient  with history of asthma, diabetes mellitus and hypertension, who started  to have vomiting of gastric contents and chest discomfort for the last 2  days' duration.  As per the patient, she started to have this problem  yesterday and it continued until today and she decided to come to the  emergency room.  She denied any diarrhea and she denied fever, but she  has chest discomfort associated with the vomiting.   REVIEW OF SYSTEMS:  Ten-point review of system is noncontributory,  except as mentioned in the HPI.   SOCIAL HISTORY:  She is a chronic smoker, less than half a pack per day,  and she denied alcohol use or drug abuse.  She is a Engineer, civil (consulting) by profession.   FAMILY HISTORY:  Noncontributory.   PAST MEDICAL HISTORY:  1. Diabetes mellitus.  2. Bronchial asthma.  3. Hypertension.   HOME MEDICATIONS:  1. Lisinopril 10 mg once a day.  2. Metformin 500 mg in a.m. and 1000 mg in p.m.  3. Aspirin 81 mg once a day.  4. Ativan 0.5 mg as needed.  5. Restoril 30 mg at bedtime.  6. Albuterol inhaler.  7. Advair Diskus.   ALLERGIES:  There is no known drug allergy.   PHYSICAL EXAMINATION:  GENERAL:  The patient is on the bed without any  visible respiratory distress.  VITAL SIGNS:  Blood pressure is 98/60 and temperature is 98.3, pulse  rate is 80 per minute, respiratory rate is 18 per minute and saturation  is 99%.  HEENT:  She has pink conjunctivae and  anicteric sclerae.  NECK:  Supple.  CHEST:  Good air entry with rhonchi bilaterally, no wheezes.  CVS:  S1 and S2 regular.  No murmur.  ABDOMEN:  Soft.  No organomegaly or area of tenderness.  Normoactive  bowel sounds.  EXTREMITIES:  She has no pedal edema.  Peripheral pulses positive.  CNS:  She is alert and well-oriented.   LABORATORY AND ACCESSORY CLINICAL DATA:  White blood cell count 7.4,  hemoglobin is 15, hematocrit is 43.8 and platelet count is 177,000.  On  the chemistry, sodium is 135, potassium is 3.6, chloride is 98, bicarb  is 26, glucose is 179, BUN 19, creatinine 0.25.  Calcium is 9.   Chest x-ray was done and it shows right perihilar density and possible  right lung nodule.   CAT scan of the chest was also done which shows right upper lobe and  right middle lobe infiltrate.   ASSESSMENT:  1. Community-acquired pneumonia.  I will put her on Rocephin and      Zithromax intravenously and will give her also oxygen through the      nasal cannula and will monitor her saturation.  2. Diabetes mellitus, fairly controlled, and will continue with her      metformin and will put her on a sliding scale as well.  3. Hypertension.  Her blood pressure is on the low side and we will      give her intravenous fluid and rehydrate her.      Marcello Moores, MD  Electronically Signed     MT/MEDQ  D:  08/05/2006  T:  08/06/2006  Job:  [id number]

## 2010-08-24 NOTE — Group Therapy Note (Signed)
NAMEQUEENIE, AUFIERO            ACCOUNT NO.:  [id number]   MEDICAL RECORD NO.:  [id number]          PATIENT TYPE:  INP   LOCATION:  A331                          FACILITY:  [facility]   PHYSICIAN:  Angus G. Renard Matter, MD   DATE OF BIRTH:  19-Jan-1956   DATE OF PROCEDURE:  DATE OF DISCHARGE:  08/06/2006                                 PROGRESS NOTE   This patient has a history of having been admitted with nausea, vomiting  and diarrhea.  She does have a history of COPD, prior history of lung  cancer, does have ongoing pneumonia right and left lower lobe and did  have episode of pulmonary edema.  Her condition appears to be better.   OBJECTIVE:  VITAL SIGNS:  Blood pressure 95/45, respirations 16, pulse  85, temperature 97.1  Most recent lab data pertinent WBC 3100 with  hemoglobin 11.2, hematocrit 31.  Have been no positive blood cultures.  LUNGS:  Diminished breath sounds.  Occasional rhonchus.  HEART:  Regular rhythm.  ABDOMEN:  No palpable organs or masses.   ASSESSMENT:  The patient was admitted with pneumonia, right and left  lower lobe and she does have a history of chronic obstructive pulmonary  disease, prior history of lung cancer, and did have episode of pulmonary  edema.   PLAN:  Continue current regimen.  Will in all likelihood be able to  discharge the patient later in week.  She is on vancomycin.      Angus G. Renard Matter, MD     AGM/MEDQ  D:  08/26/2006  T:  08/26/2006  Job:  [id number]

## 2010-08-24 NOTE — Assessment & Plan Note (Signed)
[facility]                               PULMONARY OFFICE NOTE   NAME:[name], [name]                     MRN:          [id number]  DATE:11/12/2005                            DOB:          Jun 01, 1955    PROBLEM LIST:  1.  Asthma.  2.  Esophageal reflux.  3.  Allergic rhinitis.  4.  Insomnia.   HISTORY:  Last here in February.  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  She always feels warmer than her coworkers.  We discussed  lisinopril as a potential basis for her cough, but she says the cough is not  a daily issue, and it really does not sound like enough of a problem to  bother changing her lisinopril as long as she is aware of the connection.  She is using her albuterol inhaler about once a day, and had discussed more  aggressive use of maintenance anti-inflammatory medications with Dr.  Corliss Blacker.  The patient admits to me she is really afraid mostly of the  steroids associated with Advair.  She has been using the 100/50 strength,  and I discussed that as probably insignificant relative to the benefit to be  obtained.  We discussed maintenance use of Advair 100/50 once daily with  rapid increase to b.i.d. at any sign of increasing chest symptoms.   MEDICATIONS:  1.  Advair 100/50.  2.  Prilosec.  3.  Restoril 7.5 mg, using 2 or 3 at h.s. p.r.n.  4.  Xanax has been discontinued.  5.  Lisinopril.  6.  Glucophage.  7.  Aspirin 81 mg.  8.  Zocor.  9.  Ativan.  10. Rescue albuterol.   ALLERGIES:  No medication allergy known.   OBJECTIVE:  VITAL SIGNS:  Weight 250 pounds, BP 106/62, pulse regular 87,  room air saturation 96%.  GENERAL:  She is alert, seems comfortable, ambulatory, bringing her  granddaughter with her today.  Very heavy.  CHEST:  Lung fields are clear.  CARDIAC:  Heart sounds are regular without murmur or gallop.  I find  no  adenopathy.  EXTREMITIES:  There is no clubbing, cyanosis or edema.   IMPRESSION:  1.  Asthma, borderline adequately controlled, using a rescue inhaler once      daily and Advair 100/50 once daily.  Discussion is as above.  2.  Allergic rhinitis, currently not a problem.  3.  Chronic insomnia, was not emphasized today.  4.  Esophageal reflux.  Precautions were reviewed.  5.  Obesity.  I pointed out this probably contributes significantly to      dyspnea, her asthma complaints, and her heat intolerance.   PLAN:  1.  Should be quick to increase Advair from once to twice daily, with      discussions as above.  2.  Schedule return in 4 months, earlier p.r.n.  We will be watching the      need for additional maintenance anti-inflammatory therapy.  Clinton D. Maple Hudson, MD, [facility], FACP   CDY/MedQ  DD:  11/12/2005  DT:  11/13/2005  Job #:  [id number]   cc:   Pam Drown, MD

## 2010-09-10 ENCOUNTER — Ambulatory Visit (HOSPITAL_COMMUNITY)
Admission: RE | Admit: 2010-09-10 | Discharge: 2010-09-10 | Disposition: A | Discharge status: Home / Self Care | Payer: 59 | Source: Ambulatory Visit | Attending: Family Medicine | Admitting: Family Medicine

## 2010-09-10 DIAGNOSIS — Z1231 Encounter for screening mammogram for malignant neoplasm of breast: Secondary | ICD-10-CM | POA: Insufficient documentation

## 2010-09-10 DIAGNOSIS — Z139 Encounter for screening, unspecified: Secondary | ICD-10-CM

## 2010-11-21 ENCOUNTER — Other Ambulatory Visit: Payer: Self-pay | Admitting: *Deleted

## 2010-11-21 MED ORDER — ALBUTEROL SULFATE HFA 108 (90 BASE) MCG/ACT IN AERS: 2.0000 | INHALATION_SPRAY | Freq: Four times a day (QID) | RESPIRATORY_TRACT | Status: DC | PRN

## 2010-12-14 ENCOUNTER — Ambulatory Visit: Payer: 59 | Admitting: Internal Medicine

## 2011-06-11 ENCOUNTER — Telehealth (INDEPENDENT_AMBULATORY_CARE_PROVIDER_SITE_OTHER): Payer: Self-pay | Admitting: Surgery

## 2011-06-11 NOTE — Telephone Encounter (Signed)
06/11/11 mailed recall letter for bariatric surgery f/u to patient. Adv pt to call [facility] @ [phone] to schedule an appointment....cef 

## 2012-07-16 ENCOUNTER — Telehealth (INDEPENDENT_AMBULATORY_CARE_PROVIDER_SITE_OTHER): Payer: Self-pay | Admitting: Surgery

## 2012-07-16 NOTE — Telephone Encounter (Signed)
07/16/12 phone disconnected - mailed recall letter for pt to make a bariatric follow-up appt with Dr. Ezzard Standing. Had [facility] 11/29/08 (lss)

## 2013-01-05 ENCOUNTER — Encounter (INDEPENDENT_AMBULATORY_CARE_PROVIDER_SITE_OTHER): Payer: Self-pay

## 2013-07-14 ENCOUNTER — Encounter (HOSPITAL_COMMUNITY): Payer: Self-pay | Admitting: *Deleted

## 2013-07-14 ENCOUNTER — Encounter (HOSPITAL_COMMUNITY): Payer: Self-pay | Admitting: Emergency Medicine

## 2013-07-14 ENCOUNTER — Inpatient Hospital Stay (HOSPITAL_COMMUNITY)
Admission: RE | Admit: 2013-07-14 | Discharge: 2013-07-19 | DRG: 897 | Disposition: A | Discharge status: Home / Self Care | Payer: 59 | Attending: Psychiatry | Admitting: Psychiatry

## 2013-07-14 ENCOUNTER — Emergency Department (HOSPITAL_COMMUNITY)
Admission: EM | Admit: 2013-07-14 | Discharge: 2013-07-14 | Disposition: A | Discharge status: Other Acute Inpatient Hospital | Payer: 59 | Attending: Emergency Medicine | Admitting: Emergency Medicine

## 2013-07-14 DIAGNOSIS — G4700 Insomnia, unspecified: Secondary | ICD-10-CM | POA: Diagnosis present

## 2013-07-14 DIAGNOSIS — F1020 Alcohol dependence, uncomplicated: Secondary | ICD-10-CM | POA: Insufficient documentation

## 2013-07-14 DIAGNOSIS — F32A Depression, unspecified: Secondary | ICD-10-CM

## 2013-07-14 DIAGNOSIS — F411 Generalized anxiety disorder: Secondary | ICD-10-CM | POA: Diagnosis present

## 2013-07-14 DIAGNOSIS — F17200 Nicotine dependence, unspecified, uncomplicated: Secondary | ICD-10-CM | POA: Diagnosis present

## 2013-07-14 DIAGNOSIS — F1010 Alcohol abuse, uncomplicated: Secondary | ICD-10-CM

## 2013-07-14 DIAGNOSIS — F329 Major depressive disorder, single episode, unspecified: Secondary | ICD-10-CM

## 2013-07-14 DIAGNOSIS — R45851 Suicidal ideations: Secondary | ICD-10-CM

## 2013-07-14 DIAGNOSIS — K219 Gastro-esophageal reflux disease without esophagitis: Secondary | ICD-10-CM | POA: Diagnosis present

## 2013-07-14 DIAGNOSIS — F1994 Other psychoactive substance use, unspecified with psychoactive substance-induced mood disorder: Secondary | ICD-10-CM | POA: Diagnosis present

## 2013-07-14 DIAGNOSIS — F3289 Other specified depressive episodes: Secondary | ICD-10-CM | POA: Insufficient documentation

## 2013-07-14 DIAGNOSIS — I10 Essential (primary) hypertension: Secondary | ICD-10-CM | POA: Insufficient documentation

## 2013-07-14 DIAGNOSIS — Z791 Long term (current) use of non-steroidal anti-inflammatories (NSAID): Secondary | ICD-10-CM | POA: Insufficient documentation

## 2013-07-14 DIAGNOSIS — F39 Unspecified mood [affective] disorder: Secondary | ICD-10-CM | POA: Insufficient documentation

## 2013-07-14 DIAGNOSIS — Z79899 Other long term (current) drug therapy: Secondary | ICD-10-CM | POA: Insufficient documentation

## 2013-07-14 DIAGNOSIS — F332 Major depressive disorder, recurrent severe without psychotic features: Secondary | ICD-10-CM | POA: Diagnosis present

## 2013-07-14 HISTORY — DX: Depression, unspecified: F32.A

## 2013-07-14 HISTORY — DX: Anxiety disorder, unspecified: F41.9

## 2013-07-14 HISTORY — DX: Gastro-esophageal reflux disease without esophagitis: K21.9

## 2013-07-14 HISTORY — DX: Essential (primary) hypertension: I10

## 2013-07-14 HISTORY — DX: Major depressive disorder, single episode, unspecified: F32.9

## 2013-07-14 HISTORY — DX: Alcohol dependence, uncomplicated: F10.20

## 2013-07-14 LAB — RAPID URINE DRUG SCREEN, HOSP PERFORMED
Amphetamines: NOT DETECTED
BARBITURATES: NOT DETECTED
BENZODIAZEPINES: NOT DETECTED
Cocaine: NOT DETECTED
Opiates: NOT DETECTED
Tetrahydrocannabinol: NOT DETECTED

## 2013-07-14 LAB — COMPREHENSIVE METABOLIC PANEL
ALK PHOS: 62 U/L (ref 39–117)
ALT: 14 U/L (ref 0–35)
AST: 19 U/L (ref 0–37)
Albumin: 4.1 g/dL (ref 3.5–5.2)
BILIRUBIN TOTAL: 0.3 mg/dL (ref 0.3–1.2)
BUN: 15 mg/dL (ref 6–23)
CHLORIDE: 99 meq/L (ref 96–112)
CO2: 23 meq/L (ref 19–32)
Calcium: 9.3 mg/dL (ref 8.4–10.5)
Creatinine, Ser: 0.48 mg/dL — ABNORMAL LOW (ref 0.50–1.10)
GLUCOSE: 104 mg/dL — AB (ref 70–99)
POTASSIUM: 4.3 meq/L (ref 3.7–5.3)
Sodium: 136 mEq/L — ABNORMAL LOW (ref 137–147)
Total Protein: 7 g/dL (ref 6.0–8.3)

## 2013-07-14 LAB — CBC
HEMATOCRIT: 35.7 % — AB (ref 36.0–46.0)
HEMOGLOBIN: 12.1 g/dL (ref 12.0–15.0)
MCH: 29.5 pg (ref 26.0–34.0)
MCHC: 33.9 g/dL (ref 30.0–36.0)
MCV: 87.1 fL (ref 78.0–100.0)
Platelets: 188 10*3/uL (ref 150–400)
RBC: 4.1 MIL/uL (ref 3.87–5.11)
RDW: 14 % (ref 11.5–15.5)
WBC: 6.8 10*3/uL (ref 4.0–10.5)

## 2013-07-14 LAB — ETHANOL

## 2013-07-14 LAB — SALICYLATE LEVEL: Salicylate Lvl: <2 mg/dL — ABNORMAL LOW (ref 2.8–20.0)

## 2013-07-14 LAB — ACETAMINOPHEN LEVEL

## 2013-07-14 MED ORDER — LOPERAMIDE HCL 2 MG PO CAPS: 2.0000 mg | ORAL_CAPSULE | ORAL | Status: AC | PRN

## 2013-07-14 MED ORDER — ONDANSETRON 4 MG PO TBDP: 4.0000 mg | ORAL_TABLET | Freq: Four times a day (QID) | ORAL | Status: AC | PRN

## 2013-07-14 MED ORDER — CHLORDIAZEPOXIDE HCL 25 MG PO CAPS
25.0000 mg | ORAL_CAPSULE | Freq: Four times a day (QID) | ORAL | Status: AC
  Administered 2013-07-15 (×3): 25 mg via ORAL
  Filled 2013-07-14 (×4): qty 1

## 2013-07-14 MED ORDER — MAGNESIUM HYDROXIDE 400 MG/5ML PO SUSP: 30.0000 mL | Freq: Every day | ORAL | Status: DC | PRN

## 2013-07-14 MED ORDER — ALUM & MAG HYDROXIDE-SIMETH 200-200-20 MG/5ML PO SUSP: 30.0000 mL | ORAL | Status: DC | PRN

## 2013-07-14 MED ORDER — CHLORDIAZEPOXIDE HCL 25 MG PO CAPS
25.0000 mg | ORAL_CAPSULE | ORAL | Status: AC
  Administered 2013-07-17: 25 mg via ORAL
  Filled 2013-07-14: qty 1

## 2013-07-14 MED ORDER — THIAMINE HCL 100 MG/ML IJ SOLN
100.0000 mg | Freq: Once | INTRAMUSCULAR | Status: AC
  Administered 2013-07-14: 100 mg via INTRAMUSCULAR
  Filled 2013-07-14: qty 2

## 2013-07-14 MED ORDER — NAPROXEN 250 MG PO TABS
250.0000 mg | ORAL_TABLET | Freq: Two times a day (BID) | ORAL | Status: DC
  Administered 2013-07-15 – 2013-07-19 (×9): 250 mg via ORAL
  Filled 2013-07-14 (×16): qty 1

## 2013-07-14 MED ORDER — NICOTINE 21 MG/24HR TD PT24
21.0000 mg | MEDICATED_PATCH | Freq: Every day | TRANSDERMAL | Status: DC
  Administered 2013-07-15 – 2013-07-19 (×5): 21 mg via TRANSDERMAL
  Filled 2013-07-14 (×9): qty 1

## 2013-07-14 MED ORDER — HYDROCHLOROTHIAZIDE 25 MG PO TABS
25.0000 mg | ORAL_TABLET | Freq: Every day | ORAL | Status: DC
  Administered 2013-07-15 – 2013-07-19 (×5): 25 mg via ORAL
  Filled 2013-07-14 (×8): qty 1

## 2013-07-14 MED ORDER — ALBUTEROL SULFATE HFA 108 (90 BASE) MCG/ACT IN AERS
2.0000 | INHALATION_SPRAY | Freq: Four times a day (QID) | RESPIRATORY_TRACT | Status: DC | PRN
  Administered 2013-07-15 – 2013-07-19 (×8): 2 via RESPIRATORY_TRACT
  Filled 2013-07-14: qty 6.7

## 2013-07-14 MED ORDER — VITAMIN B-1 100 MG PO TABS
100.0000 mg | ORAL_TABLET | Freq: Every day | ORAL | Status: DC
  Administered 2013-07-15 – 2013-07-18 (×4): 100 mg via ORAL
  Filled 2013-07-14 (×8): qty 1

## 2013-07-14 MED ORDER — PANTOPRAZOLE SODIUM 40 MG PO TBEC
40.0000 mg | DELAYED_RELEASE_TABLET | Freq: Every day | ORAL | Status: DC
  Administered 2013-07-15 – 2013-07-17 (×3): 40 mg via ORAL
  Filled 2013-07-14 (×5): qty 1

## 2013-07-14 MED ORDER — CHLORDIAZEPOXIDE HCL 25 MG PO CAPS
50.0000 mg | ORAL_CAPSULE | Freq: Once | ORAL | Status: AC
  Administered 2013-07-14: 50 mg via ORAL
  Filled 2013-07-14: qty 2

## 2013-07-14 MED ORDER — TRAZODONE HCL 50 MG PO TABS
50.0000 mg | ORAL_TABLET | Freq: Every evening | ORAL | Status: DC | PRN
  Administered 2013-07-14 – 2013-07-18 (×5): 50 mg via ORAL
  Filled 2013-07-14 (×5): qty 1

## 2013-07-14 MED ORDER — ACETAMINOPHEN 325 MG PO TABS
650.0000 mg | ORAL_TABLET | Freq: Four times a day (QID) | ORAL | Status: DC | PRN
  Administered 2013-07-14: 650 mg via ORAL
  Filled 2013-07-14: qty 2

## 2013-07-14 MED ORDER — LISINOPRIL 20 MG PO TABS
20.0000 mg | ORAL_TABLET | Freq: Every day | ORAL | Status: DC
  Administered 2013-07-15 – 2013-07-19 (×5): 20 mg via ORAL
  Filled 2013-07-14 (×8): qty 1

## 2013-07-14 MED ORDER — CHLORDIAZEPOXIDE HCL 25 MG PO CAPS
25.0000 mg | ORAL_CAPSULE | Freq: Every day | ORAL | Status: AC
  Filled 2013-07-14: qty 1

## 2013-07-14 MED ORDER — CHLORDIAZEPOXIDE HCL 25 MG PO CAPS: 25.0000 mg | ORAL_CAPSULE | Freq: Four times a day (QID) | ORAL | Status: AC | PRN

## 2013-07-14 MED ORDER — HYDROXYZINE HCL 25 MG PO TABS
25.0000 mg | ORAL_TABLET | Freq: Four times a day (QID) | ORAL | Status: AC | PRN
  Administered 2013-07-15: 25 mg via ORAL
  Filled 2013-07-14: qty 1

## 2013-07-14 MED ORDER — CHLORDIAZEPOXIDE HCL 25 MG PO CAPS
25.0000 mg | ORAL_CAPSULE | Freq: Three times a day (TID) | ORAL | Status: AC
  Administered 2013-07-16 (×3): 25 mg via ORAL
  Filled 2013-07-14 (×4): qty 1

## 2013-07-14 MED ORDER — FLUOXETINE HCL 20 MG PO CAPS
40.0000 mg | ORAL_CAPSULE | Freq: Every day | ORAL | Status: DC
  Administered 2013-07-15 – 2013-07-19 (×5): 40 mg via ORAL
  Filled 2013-07-14 (×4): qty 2
  Filled 2013-07-14: qty 6
  Filled 2013-07-14 (×3): qty 2

## 2013-07-14 MED ORDER — ADULT MULTIVITAMIN W/MINERALS CH
1.0000 | ORAL_TABLET | Freq: Every day | ORAL | Status: DC
  Administered 2013-07-15 – 2013-07-18 (×4): 1 via ORAL
  Filled 2013-07-14 (×8): qty 1

## 2013-07-14 NOTE — Tx Team (Signed)
Initial Interdisciplinary Treatment Plan  PATIENT STRENGTHS: (choose at least two) Ability for insight Average or above average intelligence Communication skills Motivation for treatment/growth Religious Affiliation Work skills  PATIENT STRESSORS: Health problems Legal issue Marital or family conflict Substance abuse   PROBLEM LIST: Problem List/Patient Goals Date to be addressed Date deferred Reason deferred Estimated date of resolution  Substance Abuse (ETOH) 07-14-13     SI 07-14-13     Depression 07-14-13     Asthma 07-14-13     HTN 07-14-13     GERD(hx of gastric bypass) 07-14-13                        DISCHARGE CRITERIA:  Improved stabilization in mood, thinking, and/or behavior Motivation to continue treatment in a less acute level of care Need for constant or close observation no longer present Verbal commitment to aftercare and medication compliance Withdrawal symptoms are absent or subacute and managed without 24-hour nursing intervention  PRELIMINARY DISCHARGE PLAN: Attend PHP/IOP Attend 12-step recovery group Outpatient therapy Return to previous living arrangement Return to previous work or school arrangements  PATIENT/FAMIILY INVOLVEMENT: This treatment plan has been presented to and reviewed with the patient, [place], and/or family member.  The patient and family have been given the opportunity to ask questions and make suggestions.  Zoe Lan 07/14/2013, 9:41 PM

## 2013-07-14 NOTE — ED Notes (Signed)
Pt states that she has been a functional alcoholic for years but now her life is falling apart.  States that she needs to be medically cleared to go over to [facility].

## 2013-07-14 NOTE — Progress Notes (Signed)
Per NP, [name] the patient meets criteria for inpatient hospitalization.  Patient has been accepted to [facility] Bed 506-2.  Patient reports depression, hopelessness and alcoholism.  Patient reports suicidal ideation with a plan to drive her car in front of a [employer] truck.

## 2013-07-14 NOTE — Progress Notes (Signed)
Pt did not attend group. 

## 2013-07-14 NOTE — Progress Notes (Signed)
Patient ID: [name], female   DOB: 1955-09-01, 58 y.o.   MRN: [id number] Pt. Is a 58 yo female admitted voluntarily for ETOH abuse. Pt. 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 Pt. Reports drinking for the last four years attributes drinking to stressful family issues "I got a daughter who is on drugs, she moved in with me and her three kids" "she doesn't work never have, I was paying for her a trailer and all her bills, it got to be to much" "My husband left me last year, they get into it all the time, they jealous over me, cause I take of them" Pt. Tearful during admission "I want my life back" Pt. Reports "blackouts" when she drinks. Pt. Was ticketed last night for DUI. "I was sitting in my house and the police came and took me to jail, said someone called, said I was driving erratically"  Pt. Has court date 5/21 for this charge. Pt. Has medical history of asthma, HTN, and gastric bypass. This is pt. First admission at [facility]. Pt. Has history of SA, thirty-three years ago took bottle of sleeping pills to OD. Admission completed by Probation officer, initiated by Chrys Racer B. Received admission orders from [facility]. PA. Reviewed medication with client and administered as ordered. Staff will monitor q56min for safety. Pt. Is safe on the unit and contracts for safety.
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
## 2013-07-15 DIAGNOSIS — F1020 Alcohol dependence, uncomplicated: Secondary | ICD-10-CM | POA: Diagnosis present

## 2013-07-15 DIAGNOSIS — R45851 Suicidal ideations: Secondary | ICD-10-CM

## 2013-07-15 DIAGNOSIS — F332 Major depressive disorder, recurrent severe without psychotic features: Secondary | ICD-10-CM | POA: Diagnosis present

## 2013-07-15 DIAGNOSIS — F32A Depression, unspecified: Secondary | ICD-10-CM | POA: Diagnosis present

## 2013-07-15 DIAGNOSIS — F10229 Alcohol dependence with intoxication, unspecified: Secondary | ICD-10-CM | POA: Insufficient documentation

## 2013-07-15 DIAGNOSIS — F329 Major depressive disorder, single episode, unspecified: Secondary | ICD-10-CM | POA: Diagnosis present

## 2013-07-15 MED ORDER — ALUM & MAG HYDROXIDE-SIMETH 200-200-20 MG/5ML PO SUSP: 30.0000 mL | ORAL | Status: DC | PRN

## 2013-07-15 MED ORDER — MAGNESIUM HYDROXIDE 400 MG/5ML PO SUSP: 30.0000 mL | Freq: Every day | ORAL | Status: DC | PRN

## 2013-07-15 MED ORDER — ACETAMINOPHEN 325 MG PO TABS
650.0000 mg | ORAL_TABLET | Freq: Four times a day (QID) | ORAL | Status: DC | PRN
  Administered 2013-07-18 – 2013-07-19 (×2): 650 mg via ORAL
  Filled 2013-07-15 (×2): qty 2
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
## 2013-07-15 NOTE — [facility] Suicide Risk Assessment (Signed)
[place] [place] INPATIENT:  Family/Significant Other Suicide Prevention Education  Suicide Prevention Education:  Patient Refusal for Family/Significant Other Suicide Prevention Education: The patient [name] has refused to provide written consent for family/significant other to be provided Family/Significant Other Suicide Prevention Education during admission and/or prior to discharge.  Physician notified.  [name] [name] 07/15/2013, 12:27 PM

## 2013-07-15 NOTE — Progress Notes (Signed)
D: Patient appropriate and cooperative with staff and peers. Patient's affect is blunted and mood is depressed. She reported on the self inventory sheet that she's sleeping well, appetite and ability to pay attention are both good and energy level is normal. Patient rates depression "6" and feelings of hopelessness "3". She's visible in the milieu and interactive with peers on the unit. Patient is compliant with medication regimen.  A: Support and encouragement provided to patient. Administered scheduled medications per ordering MD. Monitor Q15 minute checks for safety.  R: Patient receptive. Denies SI/HI and auditory/visual hallucinations. Patient remains safe on the unit.
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
## 2013-07-15 NOTE — Progress Notes (Signed)
The patient refused to attend group this evening and elected to go back to sleep.

## 2013-07-15 NOTE — Progress Notes (Signed)
Patient ID: [name], female   DOB: 1956-01-03, 58 y.o.   MRN: [id number] [facility] 0900 AM  The focus of this group is to educate the patient on the purpose and policies of crisis stabilization and provide a format to answer questions about their admission.  The group details unit policies and expectations of patients while admitted.  Patient did not attend group due to being with MD.

## 2013-07-15 NOTE — Progress Notes (Signed)
Recreation Therapy Notes  Animal-Assisted Activity/Therapy (AAA/T) Program Checklist/Progress Notes Patient Eligibility Criteria Checklist & Daily Group note for Rec Tx Intervention  Date: 04.09.2015 Time: 2:45pm Location: [address]    AAA/T Program Assumption of Risk Form signed by Patient/ or Parent Legal Guardian yes  Patient is free of allergies or sever asthma yes  Patient reports no fear of animals yes  Patient reports no history of cruelty to animals yes   Patient understands his/her participation is voluntary yes  Behavioral Response: DID NOT ATTEND.  Laureen Ochs Delorese Sellin, LRT/CTRS  Lane Hacker 07/15/2013 3:57 PM
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
## 2013-07-16 DIAGNOSIS — F1994 Other psychoactive substance use, unspecified with psychoactive substance-induced mood disorder: Secondary | ICD-10-CM

## 2013-07-16 DIAGNOSIS — F1010 Alcohol abuse, uncomplicated: Secondary | ICD-10-CM

## 2013-07-16 DIAGNOSIS — F332 Major depressive disorder, recurrent severe without psychotic features: Secondary | ICD-10-CM

## 2013-07-16 NOTE — Progress Notes (Signed)
Assumed care of patient at 2300. Observed pt resting in bed with eyes closed. No acute distress. No complaints voiced. Level III obs in place for safety and pt is safe. Junius Creamer Tommi Rumps

## 2013-07-16 NOTE — Progress Notes (Signed)
[facility] MD Progress Note  07/16/2013 2:56 PM [name]  MRN:  [id number] Subjective:  Patient is up and on the unit. States the Librium makes her tired. Says her family has been very supportive of her since she has been here. Reports no WD symptoms, no side effects to medication other than sedation with the Librium. Appetite is increased.  Denies Depression 0/10 and says 1/10 for anxiety. Diagnosis:   DSM5:  Schizophrenia Disorders:  Obsessive-Compulsive Disorders:  Trauma-Stressor Disorders:  Substance/Addictive Disorders:  Depressive Disorders:  AXIS I: Alcohol Abuse, Major Depression, Recurrent severe and Substance Induced Mood Disorder  AXIS II: Deferred  AXIS III:  Past Medical History   Diagnosis  Date   .  Alcoholism    .  Hypertension    .  GERD (gastroesophageal reflux disease)    .  Depression    .  Anxiety     AXIS IV: other psychosocial or environmental problems, problems related to legal system/crime, problems related to social environment and problems with primary support group  AXIS V: 41-50 serious symptoms   ADL's:  Intact  Sleep: Good  Appetite:  Good  Suicidal Ideation:  Denies  Homicidal Ideation:  denies AEB (as evidenced by):  Psychiatric Specialty Exam: Physical Exam  ROS  Blood pressure 108/77, pulse 78, temperature 97.4 F (36.3 C), temperature source Oral, resp. rate 18, height 5' 5.72" (1.669 m), weight 75.751 kg (167 lb), SpO2 96.00%.Body mass index is 27.19 kg/(m^2).  General Appearance: Disheveled  Eye Sport and exercise psychologist::  Fair  Speech:  Clear and Coherent  Volume:  Normal  Mood:  Anxious and Depressed  Affect:  Congruent  Thought Process:  Goal Directed  Orientation:  Full (Time, Place, and Person)  Thought Content:  WDL  Suicidal Thoughts:  No  Homicidal Thoughts:  No  Memory:  NA  Judgement:  Poor  Insight:  Shallow  Psychomotor Activity:  Normal  Concentration:  Fair  Recall:  [place] of Knowledge:Good  Language: Good   Akathisia:  No  Handed:  Right  AIMS (if indicated):     Assets:  Communication Skills Desire for Improvement  Sleep:  Number of Hours: 6.75   Musculoskeletal: Strength & Muscle Tone: within normal limits Gait & Station: normal Patient leans: N/A  Current Medications: Current Facility-Administered Medications  Medication Dose Route Frequency Provider Last Rate Last Dose  . acetaminophen (TYLENOL) tablet 650 mg  650 mg Oral Q6H PRN Nena Polio, PA-C      . albuterol (PROVENTIL HFA;VENTOLIN HFA) 108 (90 BASE) MCG/ACT inhaler 2 puff  2 puff Inhalation Q6H PRN Nena Polio, PA-C   2 puff at 07/16/13 [phone]  . alum & mag hydroxide-simeth (MAALOX/MYLANTA) 200-200-20 MG/5ML suspension 30 mL  30 mL Oral Q4H PRN Nena Polio, PA-C      . chlordiazePOXIDE (LIBRIUM) capsule 25 mg  25 mg Oral Q6H PRN Nena Polio, PA-C      . chlordiazePOXIDE (LIBRIUM) capsule 25 mg  25 mg Oral TID Nena Polio, PA-C   25 mg at 07/16/13 1152   Followed by  . [START ON 07/17/2013] chlordiazePOXIDE (LIBRIUM) capsule 25 mg  25 mg Oral BH-qamhs Nena Polio, PA-C       Followed by  . [START ON 07/18/2013] chlordiazePOXIDE (LIBRIUM) capsule 25 mg  25 mg Oral Daily Nena Polio, PA-C      . FLUoxetine (PROZAC) capsule 40 mg  40 mg Oral Daily Nena Polio, PA-C   40 mg at 07/16/13 0850  . lisinopril (PRINIVIL,ZESTRIL)  tablet 20 mg  20 mg Oral Daily Nena Polio, PA-C   20 mg at 07/16/13 0850   And  . hydrochlorothiazide (HYDRODIURIL) tablet 25 mg  25 mg Oral Daily Nena Polio, PA-C   25 mg at 07/16/13 0850  . hydrOXYzine (ATARAX/VISTARIL) tablet 25 mg  25 mg Oral Q6H PRN Nena Polio, PA-C   25 mg at 07/15/13 2159  . loperamide (IMODIUM) capsule 2-4 mg  2-4 mg Oral PRN Nena Polio, PA-C      . magnesium hydroxide (MILK OF MAGNESIA) suspension 30 mL  30 mL Oral Daily PRN Nena Polio, PA-C      . multivitamin with minerals tablet 1 tablet  1 tablet Oral Daily Nena Polio, PA-C   1 tablet at 07/16/13 0850   . naproxen (NAPROSYN) tablet 250 mg  250 mg Oral BID WC Nena Polio, PA-C   250 mg at 07/16/13 0850  . nicotine (NICODERM CQ - dosed in mg/24 hours) patch 21 mg  21 mg Transdermal Daily Durward Parcel, MD   21 mg at 07/16/13 0854  . ondansetron (ZOFRAN-ODT) disintegrating tablet 4 mg  4 mg Oral Q6H PRN Nena Polio, PA-C      . pantoprazole (PROTONIX) EC tablet 40 mg  40 mg Oral Daily Nena Polio, PA-C   40 mg at 07/16/13 0850  . thiamine (VITAMIN B-1) tablet 100 mg  100 mg Oral Daily Nena Polio, PA-C   100 mg at 07/16/13 0849  . traZODone (DESYREL) tablet 50 mg  50 mg Oral QHS PRN Nena Polio, PA-C   50 mg at 07/15/13 2159    Lab Results:  Results for orders placed during the hospital encounter of 07/14/13 (from the past 48 hour(s))  CBC     Status: Abnormal   Collection Time    07/14/13  3:55 PM      Result Value Ref Range   WBC 6.8  4.0 - 10.5 K/uL   RBC 4.10  3.87 - 5.11 MIL/uL   Hemoglobin 12.1  12.0 - 15.0 g/dL   HCT 35.7 (*) 36.0 - 46.0 %   MCV 87.1  78.0 - 100.0 fL   MCH 29.5  26.0 - 34.0 pg   MCHC 33.9  30.0 - 36.0 g/dL   RDW 14.0  11.5 - 15.5 %   Platelets 188  150 - 400 K/uL  COMPREHENSIVE METABOLIC PANEL     Status: Abnormal   Collection Time    07/14/13  3:55 PM      Result Value Ref Range   Sodium 136 (*) 137 - 147 mEq/L   Potassium 4.3  3.7 - 5.3 mEq/L   Chloride 99  96 - 112 mEq/L   CO2 23  19 - 32 mEq/L   Glucose, Bld 104 (*) 70 - 99 mg/dL   BUN 15  6 - 23 mg/dL   Creatinine, Ser 0.48 (*) 0.50 - 1.10 mg/dL   Calcium 9.3  8.4 - 10.5 mg/dL   Total Protein 7.0  6.0 - 8.3 g/dL   Albumin 4.1  3.5 - 5.2 g/dL   AST 19  0 - 37 U/L   ALT 14  0 - 35 U/L   Alkaline Phosphatase 62  39 - 117 U/L   Total Bilirubin 0.3  0.3 - 1.2 mg/dL   GFR calc non Af Amer >90  >90 mL/min   GFR calc Af Amer >90  >90 mL/min   Comment: (NOTE)     The eGFR has been calculated using  the CKD EPI equation.     This calculation has not been validated in all clinical  situations.     eGFR's persistently <90 mL/min signify possible Chronic Kidney     Disease.  ETHANOL     Status: None   Collection Time    07/14/13  3:55 PM      Result Value Ref Range   Alcohol, Ethyl (B) <11  0 - 11 mg/dL   Comment:            LOWEST DETECTABLE LIMIT FOR     SERUM ALCOHOL IS 11 mg/dL     FOR MEDICAL PURPOSES ONLY  SALICYLATE LEVEL     Status: Abnormal   Collection Time    07/14/13  3:55 PM      Result Value Ref Range   Salicylate Lvl <7.5 (*) 2.8 - 20.0 mg/dL  ACETAMINOPHEN LEVEL     Status: None   Collection Time    07/14/13  3:55 PM      Result Value Ref Range   Acetaminophen (Tylenol), Serum <15.0  10 - 30 ug/mL   Comment:            THERAPEUTIC CONCENTRATIONS VARY     SIGNIFICANTLY. A RANGE OF 10-30     ug/mL MAY BE AN EFFECTIVE     CONCENTRATION FOR MANY PATIENTS.     HOWEVER, SOME ARE BEST TREATED     AT CONCENTRATIONS OUTSIDE THIS     RANGE.     ACETAMINOPHEN CONCENTRATIONS     >150 ug/mL AT 4 HOURS AFTER     INGESTION AND >50 ug/mL AT 12     HOURS AFTER INGESTION ARE     OFTEN ASSOCIATED WITH TOXIC     REACTIONS.  URINE RAPID DRUG SCREEN (HOSP PERFORMED)     Status: None   Collection Time    07/14/13  3:56 PM      Result Value Ref Range   Opiates NONE DETECTED  NONE DETECTED   Cocaine NONE DETECTED  NONE DETECTED   Benzodiazepines NONE DETECTED  NONE DETECTED   Amphetamines NONE DETECTED  NONE DETECTED   Tetrahydrocannabinol NONE DETECTED  NONE DETECTED   Barbiturates NONE DETECTED  NONE DETECTED   Comment:            DRUG SCREEN FOR MEDICAL PURPOSES     ONLY.  IF CONFIRMATION IS NEEDED     FOR ANY PURPOSE, NOTIFY LAB     WITHIN 5 DAYS.                LOWEST DETECTABLE LIMITS     FOR URINE DRUG SCREEN     Drug Class       Cutoff (ng/mL)     Amphetamine      1000     Barbiturate      200     Benzodiazepine   170     Tricyclics       017     Opiates          300     Cocaine          300     THC              50    Physical  Findings: AIMS: Facial and Oral Movements Muscles of Facial Expression: None, normal Lips and Perioral Area: None, normal Jaw: None, normal Tongue: None, normal,Extremity Movements Upper (arms, wrists, hands, fingers): None, normal Lower (legs, knees, ankles, toes):  None, normal, Trunk Movements Neck, shoulders, hips: None, normal, Overall Severity Severity of abnormal movements (highest score from questions above): None, normal Incapacitation due to abnormal movements: None, normal Patient's awareness of abnormal movements (rate only patient's report): No Awareness, Dental Status Current problems with teeth and/or dentures?: No Does patient usually wear dentures?: No  CIWA:  CIWA-Ar Total: 1 COWS:     Treatment Plan Summary: Daily contact with patient to assess and evaluate symptoms and progress in treatment Medication management  Plan: 1. Continue crisis management and stabilization. 2. Medication management to reduce current symptoms to base line and improve the patient's overall level of functioning. 3. Treat health problems as indicated. 4. Develop treatment plan to decrease risk of relapse upon discharge and to reduce the need for readmission. 5. Psycho-social education regarding relapse prevention and self care. 6. Health care follow up as needed for medical problems. 7. Disposition in process. 8. ELOS: 2-4 days.  Medical Decision Making Problem Points:  Established problem, stable/improving (1) Data Points:  Review or order medicine tests (1)  I certify that inpatient services furnished can reasonably be expected to improve the patient's condition.   Nena Polio 07/16/2013, 2:56 PM

## 2013-07-16 NOTE — BHH Group Notes (Signed)
[facility] LCSW Group Therapy  07/16/2013 3:47 PM  Type of Therapy:  Group Therapy  Participation Level:  Did Not Attend  mmary of Progress/Problems:  Eulas Post Letta Cargile 07/16/2013, 3:47 PM

## 2013-07-16 NOTE — Progress Notes (Signed)
D) Pt has come out of her room today to interact with her peers. Affect is flat and mood depressed. Pt states that the medication is making her very tired and she feels that she has to sleep. Really doesn't want to take it but is fearful of not taking it . Has attended some of the groups but not all due to her tiredness. Pt rates her depression at a 2 and her hopelessness at a 0. Denies SI and HI. A) Given support, reassurance and praise. Encouaged to let this Probation officer know if she should experience any withdrawal symptoms R) Pt denies any symptoms of withdrawal. Denies SI and HI.

## 2013-07-16 NOTE — Tx Team (Signed)
Interdisciplinary Treatment Plan Update   Date Reviewed:  07/16/2013  Time Reviewed:  10:00 AM  Progress in Treatment:   Attending groups: Yes Participating in groups: Yes Taking medication as prescribed: Yes  Tolerating medication: Yes Family/Significant other contact made:  No, patient declined collateral contact Patient understands diagnosis: Yes  Discussing patient identified problems/goals with staff: Yes Medical problems stabilized or resolved: Yes Denies suicidal/homicidal ideation: Yes Patient has not harmed self or others: Yes  For review of initial/current patient goals, please see plan of care.  Estimated Length of Stay:  3-4 days  Reasons for Continued Hospitalization:  Anxiety Depression Medication stabilization   New Problems/Goals identified:    Discharge Plan or Barriers:   Home with outpatient follow up with [facility]  Additional Comments:  Attendees:  Patient:  07/16/2013 10:00 AM   Signature: Mylinda Latina, MD 07/16/2013 10:00 AM  Signature:  Nena Polio, PA 07/16/2013 10:00 AM  Signature:   07/16/2013 10:00 AM  Signature: 07/16/2013 10:00 AM  Signature:  07/16/2013 10:00 AM  Signature:  Vertie Dibbern, LCSW 07/16/2013 10:00 AM  Signature:   07/16/2013 10:00 AM  Signature:  Lucinda Dell, Care Coordinator [facility] 07/16/2013 10:00 AM  Signature:   07/16/2013 10:00 AM  Signature:  07/16/2013  10:00 AM  Signature:   Lars Pinks, RN URCM 07/16/2013  10:00 AM  Signature:   07/16/2013  10:00 AM    Scribe for Treatment Team:   Joette Catching,  07/16/2013 10:00 AM

## 2013-07-17 MED ORDER — PANTOPRAZOLE SODIUM 40 MG PO TBEC
40.0000 mg | DELAYED_RELEASE_TABLET | Freq: Two times a day (BID) | ORAL | Status: DC
  Administered 2013-07-17 – 2013-07-19 (×4): 40 mg via ORAL
  Filled 2013-07-17 (×9): qty 1

## 2013-07-17 NOTE — Progress Notes (Signed)
D) Pt has been attending the program. Rates her depression and hopelessness both at a 0 and denies SI and HI. Lists all the issues that brought her into the hospital and how she works two full time jobs because she "Loves to work". States she has trouble keeping still and likes very much to be busy. Became tearful when talking about her many issues and states that her friends thought that she needed to be here for help. A) Given support and praised. Encouraged to attend all the groups and to participate in them. Provided with a brief 1:1 R) Denies SI and HI.

## 2013-07-17 NOTE — Progress Notes (Signed)
Patient ID: [name], female   DOB: 11-Oct-1955, 58 y.o.   MRN: [id number] D)  Has been rather quiet this evening, little interaction noted with peers or staff.  Attended group and participated, came to med window afterward , denies any w/d sx, held out her hands to show they were steady and no tremor noted.  Stated her depression had triggered her drinking.  Explained that AA  usually comes to the [facility] hall in the evening, and she voiced desire to go tomorrow evening to sit in on the group.  Stated would like to program for depression during the day and detox in the evening.   A)  Will continue to monitor q 15 minutes for safety, continue POC R)  Safety maintained.

## 2013-07-17 NOTE — BHH Group Notes (Signed)
[place] LCSW Group Therapy  07/17/2013 12:52 PM  Type of Therapy:  Group Therapy  Participation Level:  Minimal  Participation Quality:  Appropriate, Sharing and Supportive  Affect:  Appropriate  Cognitive:  Alert and Oriented  Insight:  Developing/Improving and Supportive  Engagement in Therapy:  Developing/Improving and Supportive  Modes of Intervention:  Discussion, Education, Exploration, Rapport Building and Support  Summary of Progress/Problems: Pt was able to identify positive supports.  Pt was asked to leave by other staff for eval before pt could share a coping skill.  Pt was supportive of other group members and discussion.   Katha Hamming 07/17/2013, 12:52 PM

## 2013-07-17 NOTE — Progress Notes (Signed)
[facility] MD Progress Note  07/17/2013 6:18 PM [name]  MRN:  [id number] Subjective:  Patient is up and on the unit. States she discontinued the Librium because it was making her tired. Says her family has been very supportive of her since she has been here. Reports no WD symptoms, no side effects to medication other than sedation with the Librium. Appetite is increased.  Denies Depression 0/10 and says 1/10 for anxiety. She reports one episode of anxiety when they were making her switch rooms, due to her roommate being on contact precautions. She also is requesting an adjustment to her protonix, due to increased heartburn. Denies SI/HI/AVH.  Diagnosis:   DSM5:  Schizophrenia Disorders:  Obsessive-Compulsive Disorders:  Trauma-Stressor Disorders:  Substance/Addictive Disorders:  Depressive Disorders:  AXIS I: Alcohol Abuse, Major Depression, Recurrent severe and Substance Induced Mood Disorder  AXIS II: Deferred  AXIS III:  Past Medical History   Diagnosis  Date   .  Alcoholism    .  Hypertension    .  GERD (gastroesophageal reflux disease)    .  Depression    .  Anxiety     AXIS IV: other psychosocial or environmental problems, problems related to legal system/crime, problems related to social environment and problems with primary support group  AXIS V: 41-50 serious symptoms   ADL's:  Intact  Sleep: Good  Appetite:  Good  Suicidal Ideation:  Denies  Homicidal Ideation:  denies AEB (as evidenced by):  Psychiatric Specialty Exam: Physical Exam   ROS   Blood pressure 90/60, pulse 108, temperature 97.4 F (36.3 C), temperature source Oral, resp. rate 16, height 5' 5.72" (1.669 m), weight 75.751 kg (167 lb), SpO2 96.00%.Body mass index is 27.19 kg/(m^2).  General Appearance: Disheveled  Eye Sport and exercise psychologist::  Fair  Speech:  Clear and Coherent  Volume:  Normal  Mood:  Anxious and Depressed  Affect:  Congruent  Thought Process:  Goal Directed  Orientation:  Full (Time, Place,  and Person)  Thought Content:  WDL  Suicidal Thoughts:  No  Homicidal Thoughts:  No  Memory:  NA  Judgement:  Poor  Insight:  Shallow  Psychomotor Activity:  Normal  Concentration:  Fair  Recall:  [place] of Knowledge:Good  Language: Good  Akathisia:  No  Handed:  Right  AIMS (if indicated):     Assets:  Communication Skills Desire for Improvement  Sleep:  Number of Hours: 5.5   Musculoskeletal: Strength & Muscle Tone: within normal limits Gait & Station: normal Patient leans: N/A  Current Medications: Current Facility-Administered Medications  Medication Dose Route Frequency Provider Last Rate Last Dose  . acetaminophen (TYLENOL) tablet 650 mg  650 mg Oral Q6H PRN Nena Polio, PA-C      . albuterol (PROVENTIL HFA;VENTOLIN HFA) 108 (90 BASE) MCG/ACT inhaler 2 puff  2 puff Inhalation Q6H PRN Nena Polio, PA-C   2 puff at 07/17/13 0846  . alum & mag hydroxide-simeth (MAALOX/MYLANTA) 200-200-20 MG/5ML suspension 30 mL  30 mL Oral Q4H PRN Nena Polio, PA-C      . chlordiazePOXIDE (LIBRIUM) capsule 25 mg  25 mg Oral Q6H PRN Nena Polio, PA-C      . chlordiazePOXIDE (LIBRIUM) capsule 25 mg  25 mg Oral BH-qamhs Nena Polio, PA-C       Followed by  . [START ON 07/18/2013] chlordiazePOXIDE (LIBRIUM) capsule 25 mg  25 mg Oral Daily Nena Polio, PA-C      . FLUoxetine (PROZAC) capsule 40 mg  40 mg Oral  Daily Nena Polio, PA-C   40 mg at 07/17/13 0844  . lisinopril (PRINIVIL,ZESTRIL) tablet 20 mg  20 mg Oral Daily Nena Polio, PA-C   20 mg at 07/17/13 9628   And  . hydrochlorothiazide (HYDRODIURIL) tablet 25 mg  25 mg Oral Daily Nena Polio, PA-C   25 mg at 07/17/13 0844  . hydrOXYzine (ATARAX/VISTARIL) tablet 25 mg  25 mg Oral Q6H PRN Nena Polio, PA-C   25 mg at 07/15/13 2159  . loperamide (IMODIUM) capsule 2-4 mg  2-4 mg Oral PRN Nena Polio, PA-C      . magnesium hydroxide (MILK OF MAGNESIA) suspension 30 mL  30 mL Oral Daily PRN Nena Polio, PA-C      .  multivitamin with minerals tablet 1 tablet  1 tablet Oral Daily Nena Polio, PA-C   1 tablet at 07/17/13 0843  . naproxen (NAPROSYN) tablet 250 mg  250 mg Oral BID WC Nena Polio, PA-C   250 mg at 07/17/13 0843  . nicotine (NICODERM CQ - dosed in mg/24 hours) patch 21 mg  21 mg Transdermal Daily Durward Parcel, MD   21 mg at 07/17/13 0846  . ondansetron (ZOFRAN-ODT) disintegrating tablet 4 mg  4 mg Oral Q6H PRN Nena Polio, PA-C      . pantoprazole (PROTONIX) EC tablet 40 mg  40 mg Oral Daily Nena Polio, PA-C   40 mg at 07/17/13 0843  . thiamine (VITAMIN B-1) tablet 100 mg  100 mg Oral Daily Nena Polio, PA-C   100 mg at 07/17/13 0843  . traZODone (DESYREL) tablet 50 mg  50 mg Oral QHS PRN Nena Polio, PA-C   50 mg at 07/16/13 2151    Lab Results:  No results found for this or any previous visit (from the past 48 hour(s)).  Physical Findings: AIMS: Facial and Oral Movements Muscles of Facial Expression: None, normal Lips and Perioral Area: None, normal Jaw: None, normal Tongue: None, normal,Extremity Movements Upper (arms, wrists, hands, fingers): None, normal Lower (legs, knees, ankles, toes): None, normal, Trunk Movements Neck, shoulders, hips: None, normal, Overall Severity Severity of abnormal movements (highest score from questions above): None, normal Incapacitation due to abnormal movements: None, normal Patient's awareness of abnormal movements (rate only patient's report): No Awareness, Dental Status Current problems with teeth and/or dentures?: No Does patient usually wear dentures?: No  CIWA:  CIWA-Ar Total: 2 COWS:     Treatment Plan Summary: Daily contact with patient to assess and evaluate symptoms and progress in treatment Medication management  Plan: 1. Continue crisis management and stabilization. 2. Medication management to reduce current symptoms to base line and improve the patient's overall level of functioning. 3. Treat health problems  as indicated. 4. Develop treatment plan to decrease risk of relapse upon discharge and to reduce the need for readmission. 5. Psycho-social education regarding relapse prevention and self care. 6. Health care follow up as needed for medical problems. 7. Disposition in process. 8. ELOS: 2-4 days.  Medical Decision Making Problem Points:  Established problem, stable/improving (1) Data Points:  Review or order medicine tests (1)  I certify that inpatient services furnished can reasonably be expected to improve the patient's condition.   Gayland Curry Starkes FNP-BC 07/17/2013, 6:18 PM

## 2013-07-17 NOTE — Progress Notes (Signed)
Adult Psychoeducational Group Note  Date:  07/17/2013 Time:  1:09 AM  Group Topic/Focus:  Wrap-Up Group:   The focus of this group is to help patients review their daily goal of treatment and discuss progress on daily workbooks.  Participation Level:  Active  Participation Quality:  Appropriate  Affect:  Appropriate  Cognitive:  Alert  Insight: Appropriate  Engagement in Group:  Engaged  Modes of Intervention:  Discussion  Additional Comments:  Pt stated that she felt better today. This was the first day that she has been able to attend groups because the medications have kept her very sleepy. Her goal for tomorrow is to continue to get better.   Sharmon Revere 07/17/2013, 1:09 AM
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
## 2013-07-18 NOTE — Progress Notes (Signed)
D   Pt appears irritable and and angry  Her answers are short and vague and she has a frown on her face said she is ready to go home  On her self inventory sheet she denise any problems of any sort but her affect is not congruent with same   She did say she plans to look after herself instead of everyone else  She does deny suicidal ideation A   Verbal support given   Medications administered and effectiveness monitored   Q 15 min checks R   Pt safe at present
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
## 2013-07-18 NOTE — Progress Notes (Signed)
Psychoeducational Group Note  Date:  07/18/2013 Time:  0945 am  Group Topic/Focus:  Making Healthy Choices:   The focus of this group is to help patients identify negative/unhealthy choices they were using prior to admission and identify positive/healthier coping strategies to replace them upon discharge.  Participation Level:  Did Not Attend   Additional Comments:  Video on chemical dependency and discussion  Denelda Akerley J 07/18/2013, 1:15 pm 

## 2013-07-18 NOTE — Progress Notes (Signed)
Adult Psychoeducational Group Note  Date:  07/18/2013 Time:  4:01 PM  Group Topic/Focus:  Making Healthy Choices:   The focus of this group is to help patients identify negative/unhealthy choices they were using prior to admission and identify positive/healthier coping strategies to replace them upon discharge.  Participation Level:  Did Not Attend   Elisha Headland 07/18/2013, 4:01 PM

## 2013-07-18 NOTE — Progress Notes (Signed)
Patient did attend the evening speaker AA meeting.  

## 2013-07-18 NOTE — [facility] Group Notes (Signed)
[facility] LCSW Group Therapy Note 07/18/2013 / 10 AM  Type of Therapy and Topic:  Group Therapy: Avoiding Self-Sabotaging and Enabling Behaviors  Participation Level:  Minimal  Mood: Irritable, disconnected  Description of Group:     Learn how to identify obstacles, self-sabotaging and enabling behaviors, what are they, why do we do them and what needs do these behaviors meet? Discuss unhealthy relationships and how to have positive healthy boundaries with those that sabotage and enable. Explore aspects of self-sabotage and enabling in yourself and how to limit these self-destructive behaviors in everyday life.  Therapeutic Goals: 1. Patient will identify one obstacle that relates to self-sabotage and enabling behaviors 2. Patient will identify one personal self-sabotaging or enabling behavior they did prior to admission 3. Patient will demonstrate ability to communicate their needs through discussion and/or role plays.   Summary of Patient Progress: The main focus of today's process group was to explain to the adolescent what "self-sabotage" means and use Motivational Interviewing to discuss what benefits, negative or positive, were involved in a self-identified self-sabotaging behavior. We then talked about reasons the patient may want to change the behavior and her current desire to change. Pt's were given opportunity to identify their self sabotaging behaviors and identify where they are in cycle of change. Patient was willing to answer all direct questions yet visibly frustrated that religious TV show was cut off and other members were grateful. Pt reports she wants to get her home life straightened out this year and feels like she is in Maintenance stage although "I have a lot of experience with self sabotage."   Therapeutic Modalities:   Cognitive Behavioral Therapy Person-Centered Therapy Motivational Interviewing   Sheilah Pigeon, LCSW

## 2013-07-19 DIAGNOSIS — F1010 Alcohol abuse, uncomplicated: Secondary | ICD-10-CM | POA: Diagnosis present

## 2013-07-19 MED ORDER — LISINOPRIL-HYDROCHLOROTHIAZIDE 20-25 MG PO TABS: 1.0000 | ORAL_TABLET | Freq: Every morning | ORAL | Status: DC

## 2013-07-19 MED ORDER — FLUOXETINE HCL 40 MG PO CAPS: 40.0000 mg | ORAL_CAPSULE | Freq: Every morning | ORAL | Status: DC

## 2013-07-19 MED ORDER — OMEPRAZOLE 20 MG PO CPDR: 20.0000 mg | DELAYED_RELEASE_CAPSULE | Freq: Every morning | ORAL | Status: DC

## 2013-07-19 NOTE — [facility] Suicide Risk Assessment (Signed)
Suicide Risk Assessment  Discharge Assessment     Demographic Factors:  Caucasian  Total Time spent with patient: 45 minutes  Psychiatric Specialty Exam:     Blood pressure 97/74, pulse 96, temperature 97.5 F (36.4 C), temperature source Oral, resp. rate 16, height 5' 5.72" (1.669 m), weight 75.751 kg (167 lb), SpO2 96.00%.Body mass index is 27.19 kg/(m^2).  General Appearance: Fairly Groomed  Engineer, water::  Fair  Speech:  Clear and Coherent  Volume:  Normal  Mood:  Euthymic  Affect:  Appropriate  Thought Process:  Coherent and Goal Directed  Orientation:  Full (Time, Place, and Person)  Thought Content:  her plans as she moves on.   Suicidal Thoughts:  No  Homicidal Thoughts:  No  Memory:  Immediate;   Fair Recent;   Fair Remote;   Fair  Judgement:  Fair  Insight:  Present  Psychomotor Activity:  Normal  Concentration:  Fair  Recall:  [employer] of Knowledge:NA  Language: Fair  Akathisia:  No  Handed:  Right  AIMS (if indicated):     Assets:  Desire for Improvement Financial Resources/Insurance Housing Social Support Talents/Skills Vocational/Educational  Sleep:  Number of Hours: 6.5    Musculoskeletal: Strength & Muscle Tone: within normal limits Gait & Station: normal Patient leans: N/A   Mental Status Per Nursing Assessment::   On Admission:  Self-harm thoughts  Current Mental Status by Physician: In full contact with reality. There are no active suicidal ideas, plans or intent. There are no active S/S of withdrawal   Loss Factors: Legal issues  Historical Factors: NA  Risk Reduction Factors:   Sense of responsibility to family and Employed  Continued Clinical Symptoms:  Depression:   Comorbid alcohol abuse/dependence  Cognitive Features That Contribute To Risk: None identified   Suicide Risk:  Minimal: No identifiable suicidal ideation.  Patients presenting with no risk factors but with morbid ruminations; may be classified as minimal  risk based on the severity of the depressive symptoms  Discharge Diagnoses:   AXIS I:  Major Depression, Recurrent severe, Alcohol Abuse AXIS II:  No diagnosis AXIS III:   Past Medical History  Diagnosis Date  . Alcoholism   . Hypertension   . GERD (gastroesophageal reflux disease)   . Depression   . Anxiety    AXIS IV:  other psychosocial or environmental problems AXIS V:  61-70 mild symptoms  Plan Of Care/Follow-up recommendations:  Activity:  as tolerated Diet:  regular Follow [facility] the [facility]  Is patient on multiple antipsychotic therapies at discharge:  No   Has Patient had three or more failed trials of antipsychotic monotherapy by history:  No  Recommended Plan for Multiple Antipsychotic Therapies: NA    Nicholaus Bloom 07/19/2013, 2:44 PM

## 2013-07-19 NOTE — Discharge Summary (Signed)
Physician Discharge Summary Note  Patient:  [name] is an 58 y.o., female MRN:  [id number] DOB:  01-Dec-1955 Patient phone:  [phone] (home)  Patient address:   [address] [place] [address],  Total Time spent with patient: 15 minutes  Date of Admission:  07/14/2013 Date of Discharge: 07/19/2013  Reason for Admission:  Alcohol induced mood disorder  Discharge Diagnoses: Principal Problem:   Major depressive disorder, recurrent episode, severe, without mention of psychotic behavior Active Problems:   Depression   Alcohol dependence   Psychiatric Specialty Exam: Physical Exam  ROS  Blood pressure 97/74, pulse 96, temperature 97.5 F (36.4 C), temperature source Oral, resp. rate 16, height 5' 5.72" (1.669 m), weight 75.751 kg (167 lb), SpO2 96.00%.Body mass index is 27.19 kg/(m^2).  General Appearance:   Eye Contact::    Speech:    Volume:    Mood:    Affect:    Thought Process:    Orientation:    Thought Content:    Suicidal Thoughts:    Homicidal Thoughts:    Memory:    Judgement:    Insight:    Psychomotor Activity:    Concentration:    Recall:    Fund of Knowledge:  Language:   Akathisia:    Handed:    AIMS (if indicated):     Assets:    Sleep:  Number of Hours: 6.5    Past Psychiatric History:  See D/C SRA Diagnosis:  Hospitalizations:  Outpatient Care:  Substance Abuse Care:  Self-Mutilation:  Suicidal Attempts:  Violent Behaviors:   Musculoskeletal: Strength & Muscle Tone:  Gait & Station:  Patient leans:   DSM5:  Discharge Diagnoses:  AXIS I: Major Depression, Recurrent severe, Alcohol Abuse  AXIS II: No diagnosis  AXIS III:  Past Medical History   Diagnosis  Date   .  Alcoholism    .  Hypertension    .  GERD (gastroesophageal reflux disease)    .  Depression    .  Anxiety     AXIS IV: other psychosocial or environmental problems  AXIS V: 61-70 mild symptoms     Level of Care:  OP  Hospital Course:   [name] is a 58 year old WF who presented for admission to [facility] for detox from alcohol and some thoughts of death or suicide. She was medically cleared and admitted to [facility] for further stabilization and treatment.       She was evaluated by a psychiatrist and her symptoms were documented as blackouts due to alcohol use, depression, anhedonia, insomnia, psychomotor retardation, fatigue, feelings of worthlessness and guilt, difficulty concentrating, hopelessness, impaired memory, suicidal thoughts with a specific plan, and anxiety, decreased libido, poor appetite, impulsivity, irritable mood and excessive worry.       Medication management was continued with Prozac 40mg  for her symptoms of depression.  She was started on a Librium detox protocol, and her home medications were continued.  She reported significant drowsiness with the Librium.       BAL at the time of admission was <11, and her UDS was negative.  Her CIWA score was never higher than 2 and she showed little to no symptoms of withdrawal.         [name] was encouraged to participate in unit programming and attended 5/6 groups.  She was appropriate in her behavior and did not require seclusion or restraint. She tolerated the protocol well and had no other side effects.  By the day of discharge [name] voiced feeling much better, which was supported by her reports of improved mood, sleep, appetite, affect, and behavior. She denied SI/HI or AVH. She had no symptoms of withdrawal.  [name] was discharged home with plans to follow up as noted below.  Consults:  None  Significant Diagnostic Studies:  None  Discharge Vitals:   Blood pressure 97/74, pulse 96, temperature 97.5 F (36.4 C), temperature source Oral, resp. rate 16, height 5' 5.72" (1.669 m), weight 75.751 kg (167 lb), SpO2 96.00%. Body mass index is 27.19 kg/(m^2). Lab Results:   No results found for this or any previous visit (from the past 72 hour(s)).  Physical  Findings: AIMS: Facial and Oral Movements Muscles of Facial Expression: None, normal Lips and Perioral Area: None, normal Jaw: None, normal Tongue: None, normal,Extremity Movements Upper (arms, wrists, hands, fingers): None, normal Lower (legs, knees, ankles, toes): None, normal, Trunk Movements Neck, shoulders, hips: None, normal, Overall Severity Severity of abnormal movements (highest score from questions above): None, normal Incapacitation due to abnormal movements: None, normal Patient's awareness of abnormal movements (rate only patient's report): No Awareness, Dental Status Current problems with teeth and/or dentures?: No Does patient usually wear dentures?: No  CIWA:  CIWA-Ar Total: 0 COWS:     Psychiatric Specialty Exam: See Psychiatric Specialty Exam and Suicide Risk Assessment completed by Attending Physician prior to discharge.  Discharge destination:  Home  Is patient on multiple antipsychotic therapies at discharge:  No   Has Patient had three or more failed trials of antipsychotic monotherapy by history:  No  Recommended Plan for Multiple Antipsychotic Therapies: NA  Discharge Orders   Future Orders Complete By Expires   Diet - low sodium heart healthy  As directed    Discharge instructions  As directed    Increase activity slowly  As directed        Medication List    STOP taking these medications       ibuprofen 200 MG tablet  Commonly known as:  ADVIL,MOTRIN     LORazepam 1 MG tablet  Commonly known as:  ATIVAN     naproxen sodium 220 MG tablet  Commonly known as:  ANAPROX      TAKE these medications     Indication   albuterol 108 (90 BASE) MCG/ACT inhaler  Commonly known as:  PROVENTIL HFA;VENTOLIN HFA  Inhale 2 puffs into the lungs every 6 (six) hours as needed for wheezing or shortness of breath.      FLUoxetine 40 MG capsule  Commonly known as:  PROZAC  Take 1 capsule (40 mg total) by mouth every morning.   Indication:  Excessive Use of  Alcohol, Depression     lisinopril-hydrochlorothiazide 20-25 MG per tablet  Commonly known as:  PRINZIDE,ZESTORETIC  Take 1 tablet by mouth every morning.   Indication:  High Blood Pressure     omeprazole 20 MG capsule  Commonly known as:  PRILOSEC  Take 1 capsule (20 mg total) by mouth every morning.   Indication:  Gastroesophageal Reflux Disease with Current Symptoms     VITAMIN D3 PO  Take 1,000 Units by mouth daily.    vitamin D deficiency          Follow-up Information   Follow up with Dr. Harrington Challenger - [facility] On 07/22/2013. (Thursday July 22, 2013 at 1:45 PM)    Contact information:   [address] [place], [place]   [address]  [phone]  Follow up with [facility] On 08/03/2013. (Tuesday August 03, 2013 at 2:45 PM)    Contact information:   [address] [place], [place]   [address]  [phone]      Follow-up recommendations:   Activities: Resume activity as tolerated. Diet: Heart healthy low sodium diet Tests: Follow up testing will be determined by your out patient provider.  Comments:    Total Discharge Time:  Less than 30 minutes.  Signed: Nena Polio 07/19/2013, 10:55 AM Personally evaluated the patient and agree with assessment and plan Geralyn Flash A. Sabra Heck, M.D.

## 2013-07-19 NOTE — Progress Notes (Signed)
Patient ID: [name], female   DOB: 11/07/1955, 58 y.o.   MRN: [id number]  Pt. Denies SI/HI and A/V hallucinations. Patient rates her depression and hopelessness at 0/10. Belongings returned to patient at time of discharge. Patient denies any pain or discomfort. Discharge instructions and medications were reviewed with patient. Patient verbalized understanding of both medications and discharge instructions. Q15 minute safety checks were maintained until discharge. No distress noted until discharge.

## 2013-07-19 NOTE — BHH Group Notes (Signed)
[facility] LCSW Aftercare Discharge Planning Group Note   07/19/2013 11:32 AM  Participation Quality:  Appropriate   Mood/Affect:  Appropriate  Depression Rating:  0  Anxiety Rating:  0  Thoughts of Suicide:  No Will you contract for safety?   NA  Current AVH:  No  Plan for Discharge/Comments:  Pt plans to return home and follow up with Lucina Mellow for med management and therapy.   Transportation Means: daughter   Supports: family supports identified   Civil engineer, contracting

## 2013-07-19 NOTE — Progress Notes (Signed)
D: Pt denies SI/HI/AVH. Pt is pleasant and cooperative.Pt stated she had a HA earlier and was given Tylenol. Pt ready to leave. Pt very easy going and appears happier after talking tonight.   A: Pt was offered support and encouragement. Pt was given scheduled medications. Pt was encourage to attend groups. Q 15 minute checks were done for safety.   R:Pt attends groups and interacts well with peers and staff. Pt is taking medication.Pt receptive to treatment and safety maintained on unit.

## 2013-07-19 NOTE — Progress Notes (Signed)
[facility] Adult Case Management Discharge Plan :  Will you be returning to the same living situation after discharge: Yes,  home At discharge, do you have transportation home?:Yes,  daughter Do you have the ability to pay for your medications:Yes,  [employer]  Release of information consent forms completed and submitted to Medical Records by CSW. Patient to Follow up at: Follow-up Information   Follow up with Dr. Harrington Challenger - [facility] On 07/22/2013. (Thursday July 22, 2013 at 1:45 PM)    Contact information:   [address] [place], [place]   [address]  [phone]      Follow up with [facility] On 08/03/2013. (Tuesday August 03, 2013 at 2:45 PM)    Contact information:   [address] [place], [place]   [address]  [phone]      Patient denies SI/HI:   Yes,  during group/self report.    Safety Planning and Suicide Prevention discussed:  Yes,  PT refused to consent to family contact. SPE completed with pt and she was encouraged to share information with support network, ask questions, and talk about any concerns relating to SPE.  Rourke Mcquitty Smart LCSWA  07/19/2013, 11:58 AM

## 2013-07-21 NOTE — Progress Notes (Signed)
Patient Discharge Instructions:  Next Level Care Provider Has Access to the EMR, 07/21/13 Records provided to [facility] via CHL/Epic access.  Patsey Berthold, 07/21/2013, 3:37 PM

## 2013-07-22 ENCOUNTER — Ambulatory Visit (HOSPITAL_COMMUNITY): Payer: Self-pay | Admitting: Psychiatry

## 2013-08-03 ENCOUNTER — Ambulatory Visit (HOSPITAL_COMMUNITY): Payer: Self-pay | Admitting: Psychiatry

## 2013-08-10 ENCOUNTER — Ambulatory Visit (INDEPENDENT_AMBULATORY_CARE_PROVIDER_SITE_OTHER): Payer: 59 | Admitting: Psychiatry

## 2013-08-10 DIAGNOSIS — F1020 Alcohol dependence, uncomplicated: Secondary | ICD-10-CM

## 2013-08-10 DIAGNOSIS — F1010 Alcohol abuse, uncomplicated: Secondary | ICD-10-CM

## 2013-08-10 DIAGNOSIS — F332 Major depressive disorder, recurrent severe without psychotic features: Secondary | ICD-10-CM
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
## 2013-08-10 NOTE — Patient Instructions (Signed)
Discussed orally 

## 2013-09-07 ENCOUNTER — Telehealth (HOSPITAL_COMMUNITY): Payer: Self-pay | Admitting: *Deleted

## 2013-09-13 ENCOUNTER — Ambulatory Visit (HOSPITAL_COMMUNITY): Payer: Self-pay | Admitting: Psychiatry

## 2015-02-10 ENCOUNTER — Encounter (HOSPITAL_COMMUNITY): Payer: Self-pay | Admitting: *Deleted
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
## 2015-02-11 ENCOUNTER — Emergency Department (HOSPITAL_COMMUNITY)
Admission: EM | Admit: 2015-02-11 | Discharge: 2015-02-11 | Disposition: A | Discharge status: Home / Self Care | Payer: 59 | Attending: Emergency Medicine | Admitting: Emergency Medicine

## 2015-02-11 ENCOUNTER — Emergency Department (HOSPITAL_COMMUNITY): Payer: 59

## 2015-02-11 DIAGNOSIS — F419 Anxiety disorder, unspecified: Secondary | ICD-10-CM | POA: Diagnosis not present

## 2015-02-11 DIAGNOSIS — K219 Gastro-esophageal reflux disease without esophagitis: Secondary | ICD-10-CM | POA: Diagnosis not present

## 2015-02-11 DIAGNOSIS — Y92410 Unspecified street and highway as the place of occurrence of the external cause: Secondary | ICD-10-CM | POA: Insufficient documentation

## 2015-02-11 DIAGNOSIS — T148XXA Other injury of unspecified body region, initial encounter: Secondary | ICD-10-CM

## 2015-02-11 DIAGNOSIS — Y998 Other external cause status: Secondary | ICD-10-CM | POA: Diagnosis not present

## 2015-02-11 DIAGNOSIS — S0990XA Unspecified injury of head, initial encounter: Secondary | ICD-10-CM | POA: Diagnosis present

## 2015-02-11 DIAGNOSIS — S40012A Contusion of left shoulder, initial encounter: Secondary | ICD-10-CM

## 2015-02-11 DIAGNOSIS — S8002XA Contusion of left knee, initial encounter: Secondary | ICD-10-CM | POA: Diagnosis not present

## 2015-02-11 DIAGNOSIS — I10 Essential (primary) hypertension: Secondary | ICD-10-CM | POA: Insufficient documentation

## 2015-02-11 DIAGNOSIS — Z79899 Other long term (current) drug therapy: Secondary | ICD-10-CM | POA: Diagnosis not present

## 2015-02-11 DIAGNOSIS — F329 Major depressive disorder, single episode, unspecified: Secondary | ICD-10-CM | POA: Diagnosis not present

## 2015-02-11 DIAGNOSIS — S0083XA Contusion of other part of head, initial encounter: Secondary | ICD-10-CM | POA: Insufficient documentation

## 2015-02-11 DIAGNOSIS — Z720 Tobacco use: Secondary | ICD-10-CM | POA: Diagnosis not present

## 2015-02-11 DIAGNOSIS — S20212A Contusion of left front wall of thorax, initial encounter: Secondary | ICD-10-CM | POA: Insufficient documentation

## 2015-02-11 DIAGNOSIS — S0093XA Contusion of unspecified part of head, initial encounter: Secondary | ICD-10-CM

## 2015-02-11 DIAGNOSIS — S2001XA Contusion of right breast, initial encounter: Secondary | ICD-10-CM

## 2015-02-11 DIAGNOSIS — S8001XA Contusion of right knee, initial encounter: Secondary | ICD-10-CM | POA: Diagnosis not present

## 2015-02-11 DIAGNOSIS — Y9389 Activity, other specified: Secondary | ICD-10-CM | POA: Diagnosis not present

## 2015-02-11 MED ORDER — METHOCARBAMOL 500 MG PO TABS: ORAL_TABLET | ORAL | Status: DC
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
## 2015-02-11 NOTE — ED Notes (Signed)
Dr Knapp at bedside,  
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
## 2016-07-02 ENCOUNTER — Encounter (HOSPITAL_COMMUNITY): Payer: Self-pay

## 2016-10-02 ENCOUNTER — Encounter (HOSPITAL_COMMUNITY): Payer: Self-pay | Admitting: Emergency Medicine

## 2016-10-02 ENCOUNTER — Emergency Department (HOSPITAL_COMMUNITY): Payer: Self-pay

## 2016-10-02 ENCOUNTER — Emergency Department (HOSPITAL_COMMUNITY)
Admission: EM | Admit: 2016-10-02 | Discharge: 2016-10-02 | Disposition: A | Discharge status: Home / Self Care | Payer: Self-pay | Attending: Emergency Medicine | Admitting: Emergency Medicine

## 2016-10-02 DIAGNOSIS — J45909 Unspecified asthma, uncomplicated: Secondary | ICD-10-CM | POA: Insufficient documentation

## 2016-10-02 DIAGNOSIS — Z79899 Other long term (current) drug therapy: Secondary | ICD-10-CM | POA: Insufficient documentation

## 2016-10-02 DIAGNOSIS — I10 Essential (primary) hypertension: Secondary | ICD-10-CM | POA: Insufficient documentation

## 2016-10-02 DIAGNOSIS — R55 Syncope and collapse: Secondary | ICD-10-CM | POA: Insufficient documentation

## 2016-10-02 DIAGNOSIS — F17210 Nicotine dependence, cigarettes, uncomplicated: Secondary | ICD-10-CM | POA: Insufficient documentation

## 2016-10-02 LAB — URINALYSIS, MICROSCOPIC (REFLEX): RBC / HPF: NONE SEEN RBC/hpf (ref 0–5)

## 2016-10-02 LAB — CBC WITH DIFFERENTIAL/PLATELET
BASOS PCT: 0 %
Basophils Absolute: 0 10*3/uL (ref 0.0–0.1)
Eosinophils Absolute: 0.1 10*3/uL (ref 0.0–0.7)
Eosinophils Relative: 1 %
HEMATOCRIT: 37 % (ref 36.0–46.0)
HEMOGLOBIN: 11.9 g/dL — AB (ref 12.0–15.0)
LYMPHS ABS: 2 10*3/uL (ref 0.7–4.0)
Lymphocytes Relative: 29 %
MCH: 27.4 pg (ref 26.0–34.0)
MCHC: 32.2 g/dL (ref 30.0–36.0)
MCV: 85.3 fL (ref 78.0–100.0)
Monocytes Absolute: 0.5 10*3/uL (ref 0.1–1.0)
Monocytes Relative: 7 %
NEUTROS ABS: 4.2 10*3/uL (ref 1.7–7.7)
Neutrophils Relative %: 63 %
Platelets: 207 10*3/uL (ref 150–400)
RBC: 4.34 MIL/uL (ref 3.87–5.11)
RDW: 15.5 % (ref 11.5–15.5)
WBC: 6.8 10*3/uL (ref 4.0–10.5)

## 2016-10-02 LAB — URINALYSIS, ROUTINE W REFLEX MICROSCOPIC
GLUCOSE, UA: NEGATIVE mg/dL
Hgb urine dipstick: NEGATIVE
KETONES UR: 15 mg/dL — AB
NITRITE: NEGATIVE
Protein, ur: 100 mg/dL — AB
SPECIFIC GRAVITY, URINE: 1.025 (ref 1.005–1.030)
pH: 5 (ref 5.0–8.0)

## 2016-10-02 LAB — BASIC METABOLIC PANEL
Anion gap: 10 (ref 5–15)
BUN: 19 mg/dL (ref 6–20)
CALCIUM: 8.9 mg/dL (ref 8.9–10.3)
CO2: 25 mmol/L (ref 22–32)
Chloride: 97 mmol/L — ABNORMAL LOW (ref 101–111)
Creatinine, Ser: 0.82 mg/dL (ref 0.44–1.00)
GFR calc non Af Amer: >60 mL/min (ref >60)
Glucose, Bld: 103 mg/dL — ABNORMAL HIGH (ref 65–99)
POTASSIUM: 3.6 mmol/L (ref 3.5–5.1)
Sodium: 132 mmol/L — ABNORMAL LOW (ref 135–145)

## 2016-10-02 LAB — CBG MONITORING, ED: GLUCOSE-CAPILLARY: 107 mg/dL — AB (ref 65–99)

## 2016-10-02 MED ORDER — SODIUM CHLORIDE 0.9 % IV BOLUS (SEPSIS)
1000.0000 mL | Freq: Once | INTRAVENOUS | Status: AC
  Administered 2016-10-02: 1000 mL via INTRAVENOUS
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
## 2016-10-02 NOTE — ED Notes (Signed)
Dr. Liu at bedside 

## 2016-10-02 NOTE — ED Notes (Signed)
Spoke to Dr. Oleta Mouse about the input of orthostatic vital signs, no new orthostatics needed at this time per Dr. Oleta Mouse. Dr. Oleta Mouse okay with pt bp 103/76.

## 2016-10-02 NOTE — Discharge Instructions (Signed)
Please hold your blood pressure medications for now and call your primary care doctor for close follow-up.  Return for worsening symptoms, including recurrent passing out, chest pain, difficulty breathing, prolonged confusion or any other symptoms concerning to you.  Drink plenty of fluids.

## 2016-10-02 NOTE — ED Notes (Signed)
EKG given to Dr. Liu.  

## 2016-10-02 NOTE — ED Notes (Signed)
Pt made aware to return if symptoms worsen or if any life threatening symptoms occur.   

## 2016-10-02 NOTE — ED Provider Notes (Signed)
[facility] DEPT Provider Note   CSN: [id number] Arrival date & time: 10/02/16  1302     History   Chief Complaint Chief Complaint  Patient presents with  . Near Syncope    HPI [name] is a 61 y.o. female.  The history is provided by the patient.  Near Syncope  This is a new problem. The current episode started 1 to 2 hours ago. The problem occurs rarely. The problem has been resolved. Associated symptoms include headaches. Pertinent negatives include no chest pain, no abdominal pain and no shortness of breath. Nothing aggravates the symptoms. The symptoms are relieved by position. She has tried nothing for the symptoms.    61 year old female who presents with near syncope. History of HTN. Works at [employer], and was standing up for a few hours when she felt like she "was going to go into a black hole." reports feeling lightheaded, nauseated, with blurry vision and seeing black spots. States she felt like she was going to pass out. She squatted down and put her head between her legs, and states she feeling resolved. No chest pain, dyspnea, abdominal pain, back pain, or headache. She however does state she has been having weekly headaches recently, diffuse and generalized. No nausea, vomiting, diarrhea. No fever or chills or recent illness. No melena or hematochezia. Denies significant EtOH recently.   Past Medical History:  Diagnosis Date  . Alcoholism ([place])   . Anxiety   . Depression   . GERD (gastroesophageal reflux disease)   . Hypertension     Patient Active Problem List   Diagnosis Date Noted  . Alcohol abuse 07/19/2013  . Alcohol dependence with intoxication ([place]) 07/15/2013  . Major depressive disorder, recurrent episode, severe, without mention of psychotic behavior 07/15/2013  . CHEST PAIN, ATYPICAL 11/30/2009  . ASTHMATIC BRONCHITIS, ACUTE 05/05/2007  . ALLERGIC RHINITIS 05/05/2007  . ASTHMA 05/05/2007  . ESOPHAGEAL REFLUX 05/05/2007  . INSOMNIA  05/05/2007    History reviewed. No pertinent surgical history.  OB History    No data available       Home Medications    Prior to Admission medications   Medication Sig Start Date End Date Taking? Authorizing Provider  albuterol (PROVENTIL HFA;VENTOLIN HFA) 108 (90 BASE) MCG/ACT inhaler Inhale 2 puffs into the lungs every 6 (six) hours as needed for wheezing or shortness of breath.   Yes [provider]  lisinopril-hydrochlorothiazide (PRINZIDE,ZESTORETIC) 20-25 MG per tablet Take 1 tablet by mouth every morning. 07/19/13  Yes Mashburn, Marlane Hatcher, PA-C  LORazepam (ATIVAN) 1 MG tablet Take 1 mg by mouth daily. 09/04/16  Yes [provider]  meloxicam (MOBIC) 15 MG tablet Take 15 mg by mouth daily. 08/22/16  Yes [provider]  naproxen sodium (ANAPROX) 220 MG tablet Take 440 mg by mouth 2 (two) times daily with a meal.   Yes [provider]  omeprazole (PRILOSEC) 20 MG capsule Take 1 capsule (20 mg total) by mouth every morning. 07/19/13  Yes Mashburn, Marlane Hatcher, PA-C  Potassium 99 MG TABS Take 2 tablets by mouth daily.   Yes [provider]  traZODone (DESYREL) 100 MG tablet Take 100 mg by mouth at bedtime. 08/22/16  Yes [provider]  FLUoxetine (PROZAC) 40 MG capsule Take 1 capsule (40 mg total) by mouth every morning. 07/19/13   Ruben Im PA-C    Family History History reviewed. No pertinent family history.  Social History Social History  Substance Use Topics  . Smoking  status: Current Every Day Smoker    Types: Cigarettes  . Smokeless tobacco: Never Used  . Alcohol use 12.3 oz/week    18 Cans of beer, 3 Standard drinks or equivalent per week     Comment: 24 beers, or 2 fifths (10/02/16-none now)     Allergies   Atorvastatin and Morphine   Review of Systems Review of Systems  Constitutional: Negative for fever.  HENT: Positive for congestion.   Respiratory: Negative for shortness of breath.   Cardiovascular:  Positive for near-syncope. Negative for chest pain.  Gastrointestinal: Negative for abdominal pain.  Genitourinary: Negative for difficulty urinating.  Musculoskeletal: Negative for back pain.  Allergic/Immunologic: Negative for immunocompromised state.  Neurological: Positive for headaches.  Hematological: Does not bruise/bleed easily.  Psychiatric/Behavioral: Negative for confusion.  All other systems reviewed and are negative.    Physical Exam Updated Vital Signs BP 103/76 (BP Location: Left Arm)   Pulse 69   Temp 97.9 F (36.6 C) (Oral)   Resp 18   SpO2 99%   Physical Exam Physical Exam  Nursing note and vitals reviewed. Constitutional: Well developed, well nourished, non-toxic, and in no acute distress Head: Normocephalic and atraumatic.  Mouth/Throat: Oropharynx is clear and moist.  Neck: Normal range of motion. Neck supple.  Cardiovascular: Normal rate and regular rhythm.   Pulmonary/Chest: Effort normal and breath sounds normal.  Abdominal: Soft. There is no tenderness. There is no rebound and no guarding.  Musculoskeletal: Normal range of motion.  Skin: Skin is warm and dry.  Psychiatric: Cooperative Neurological:  Alert, oriented to person, place, time, and situation. Memory grossly in tact. Fluent speech. No dysarthria or aphasia.  Cranial nerves: . EOMI without nystagmus. No gaze deviation. Facial muscles symmetric with activation. Sensation to light touch over face in tact bilaterally. Hearing grossly in tact. Palate elevates symmetrically. Head turn and shoulder shrug are intact. Tongue midline.  Reflexes defered.  Muscle bulk and tone normal. No pronator drift. Moves all extremities symmetrically. Sensation to light touch is in tact throughout in bilateral upper and lower extremities. Coordination reveals no dysmetria with finger to nose. Gait is narrow-based and steady. Non-ataxic.    ED Treatments / Results  Labs (all labs ordered are listed, but only  abnormal results are displayed) Labs Reviewed  BASIC METABOLIC PANEL - Abnormal; Notable for the following:       Result Value   Sodium 132 (*)    Chloride 97 (*)    Glucose, Bld 103 (*)    All other components within normal limits  URINALYSIS, ROUTINE W REFLEX MICROSCOPIC - Abnormal; Notable for the following:    APPearance HAZY (*)    Bilirubin Urine MODERATE (*)    Ketones, ur 15 (*)    Protein, ur 100 (*)    Leukocytes, UA TRACE (*)    All other components within normal limits  CBC WITH DIFFERENTIAL/PLATELET - Abnormal; Notable for the following:    Hemoglobin 11.9 (*)    All other components within normal limits  URINALYSIS, MICROSCOPIC (REFLEX) - Abnormal; Notable for the following:    Bacteria, UA FEW (*)    Squamous Epithelial / LPF 6-30 (*)    All other components within normal limits  CBG MONITORING, ED - Abnormal; Notable for the following:    Glucose-Capillary 107 (*)    All other components within normal limits    EKG  EKG Interpretation  Date/Time:  Wednesday October 02 2016 13:12:05 EDT Ventricular Rate:  71 PR Interval:  154 QRS Duration: 66 QT Interval:  400 QTC Calculation: 434 R Axis:   72 Text Interpretation:  Normal sinus rhythm Septal infarct , age undetermined Abnormal ECG similar to previous EKG  Confirmed by Brantley Stage [phone]) on 10/02/2016 1:28:32 PM       Radiology Ct Head Wo Contrast  Result Date: 10/02/2016 CLINICAL DATA:  Frequent atypical headaches, near syncopal episode today, history hypertension EXAM: CT HEAD WITHOUT CONTRAST TECHNIQUE: Contiguous axial images were obtained from the base of the skull through the vertex without intravenous contrast. Sagittal and coronal MPR images reconstructed from axial data set. COMPARISON:  02/11/2015 FINDINGS: Brain: Normal ventricular morphology. No midline shift or mass effect. Normal appearance of brain parenchyma. No intracranial hemorrhage, mass lesion, evidence of acute infarction, or extra-axial  fluid collection. Vascular: Mild atherosclerotic calcifications at the internal carotid arteries at the skullbase. Skull: Intact Sinuses/Orbits: Air-fluid level at apex of LEFT maxillary sinus Other: N/A IMPRESSION: No acute intracranial abnormalities. Air-fluid level LEFT maxillary sinus apex question LEFT maxillary sinusitis. Electronically Signed   By: Lavonia Stephnie Parlier M.D.   On: 10/02/2016 15:54    Procedures Procedures (including critical care time)  Medications Ordered in ED Medications  sodium chloride 0.9 % bolus 1,000 mL (0 mLs Intravenous Stopped 10/02/16 1515)  sodium chloride 0.9 % bolus 1,000 mL (0 mLs Intravenous Stopped 10/02/16 1515)     Initial Impression / Assessment and Plan / ED Course  I have reviewed the triage vital signs and the nursing notes.  Pertinent labs & imaging results that were available during my care of the patient were reviewed by me and considered in my medical decision making (see chart for details).     61 year old female who presents with near syncope. She is well appearing and in no acute distress. After walking into ED was noted to be hypotensive SBP 80s. Suspect orthostasis causing hypotension by history and as she states BP has been low after intentionally losing 120 lbs over the year after surgery and still taking BP medications.  EKG without ischemia or major stigmata of arrhythmia. She is given IV fluids, and feels improved. Able to ambulate around the emergency department without any symptoms. Blood work overall reassuring. She is requesting discharge home. I doubt cardiogenic or other serious cause of syncope at this time.  Patient told to hold BP medications and call PCP closely for recheck. Strict return and follow-up instructions reviewed. She expressed understanding of all discharge instructions and felt comfortable with the plan of care.   Final Clinical Impressions(s) / ED Diagnoses   Final diagnoses:  Near syncope    New Prescriptions New  Prescriptions   No medications on file     Forde Dandy, MD 10/02/16 [phone]

## 2017-06-24 ENCOUNTER — Encounter (HOSPITAL_COMMUNITY): Payer: Self-pay

## 2019-02-23 IMAGING — CT CT HEAD W/O CM
3 series · 16 of 47 positions shown, 19 images · non-contrast
Comparison: 02/11/2015

CLINICAL DATA: Frequent atypical headaches, near syncopal episode
today, history hypertension

EXAM:
CT HEAD WITHOUT CONTRAST
TECHNIQUE: Contiguous axial images were obtained from the base of the skull
through the vertex without intravenous contrast. Sagittal and
coronal MPR images reconstructed from axial data set.

[Series 2: head wo · axial · 0.41mm/px · z∈[+1467,+1592]mm · 10 of 31 slices shown, 13 images]
[im 3/31  brain]
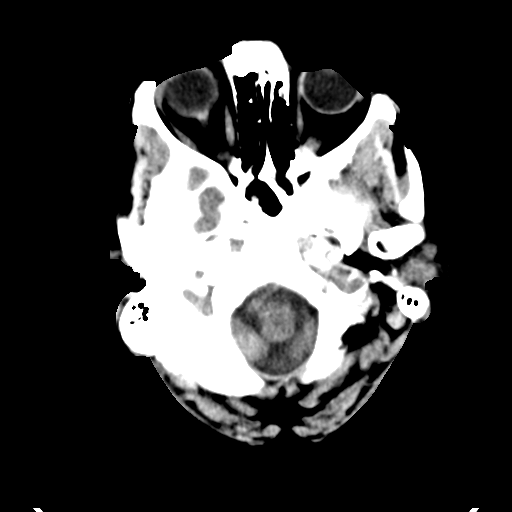
[im 3/31  bone]
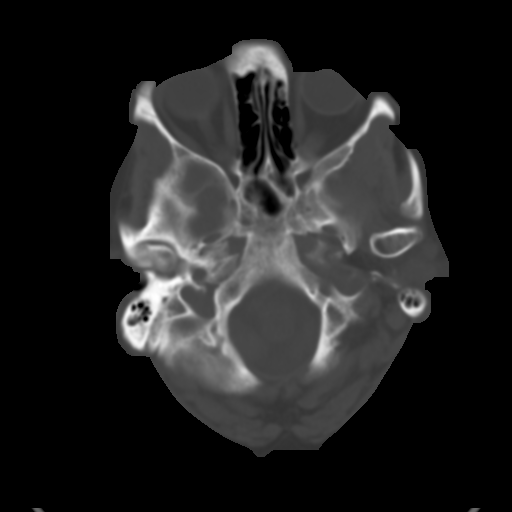
[im 6/31  brain]
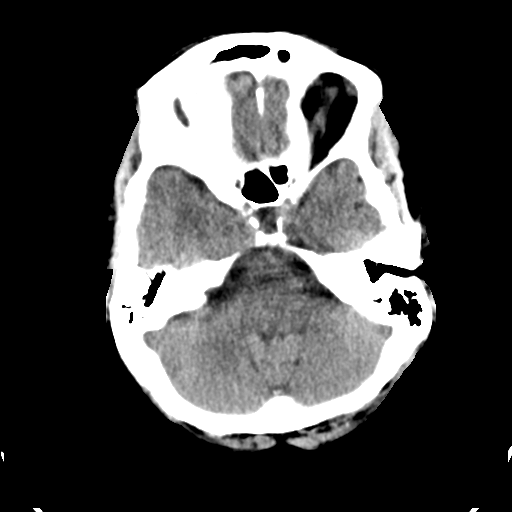
[im 9/31  brain]
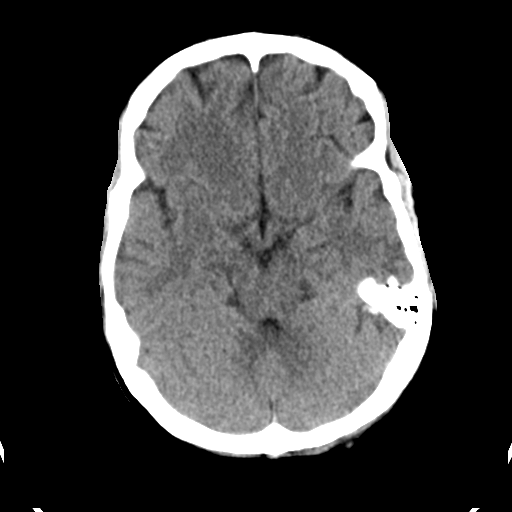
[im 11/31  brain]
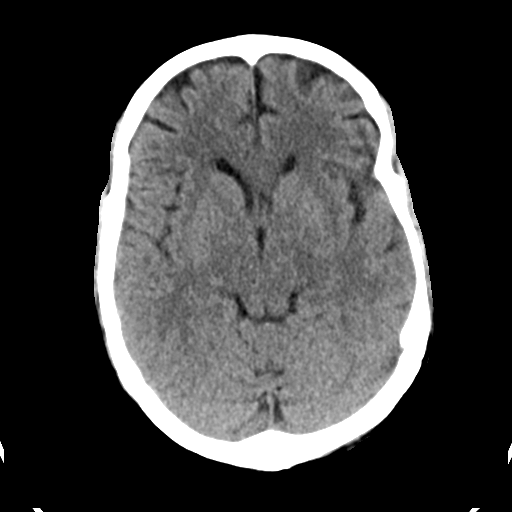
[im 14/31  brain]
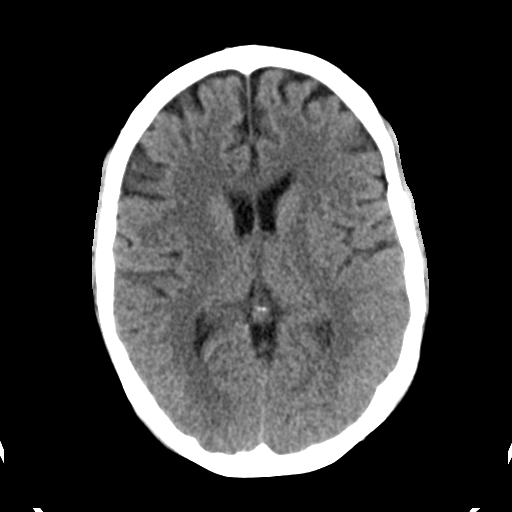
[im 14/31  bone]
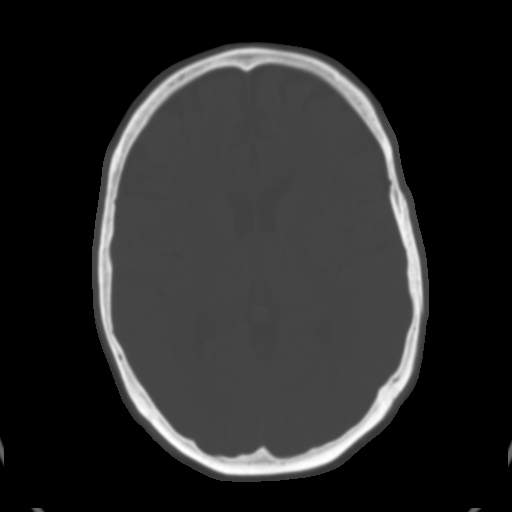
[im 17/31  brain]
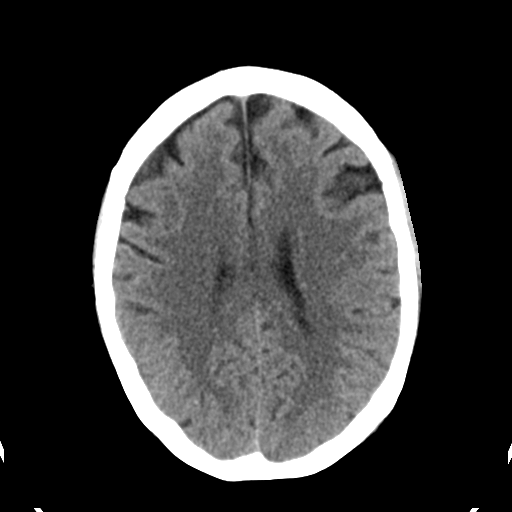
[im 20/31  brain]
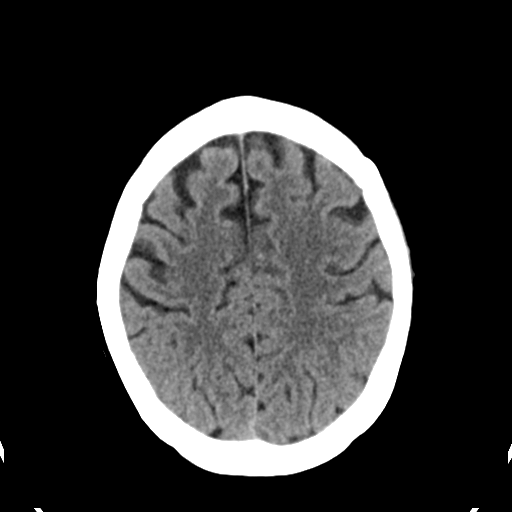
[im 23/31  brain]
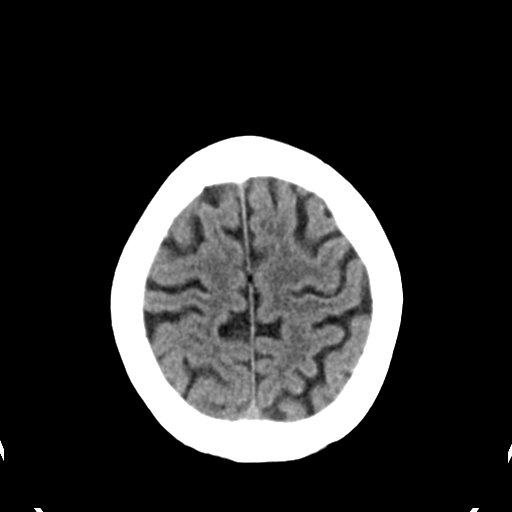
[im 25/31  brain]
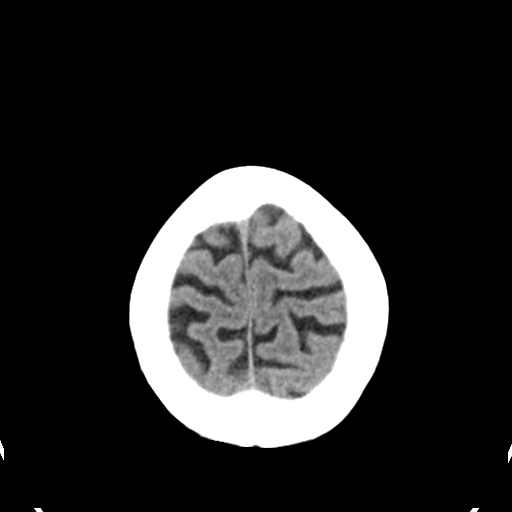
[im 25/31  bone]
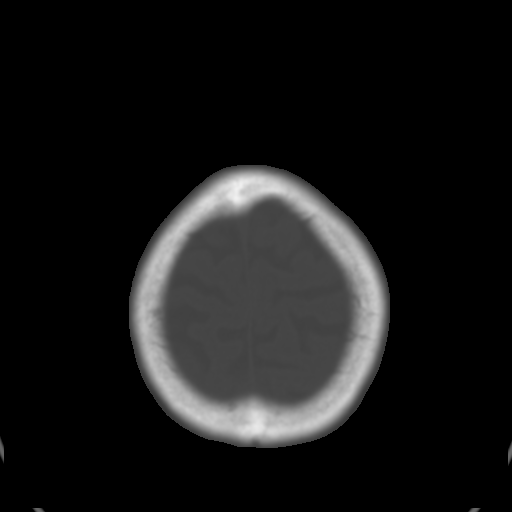
[im 28/31  brain]
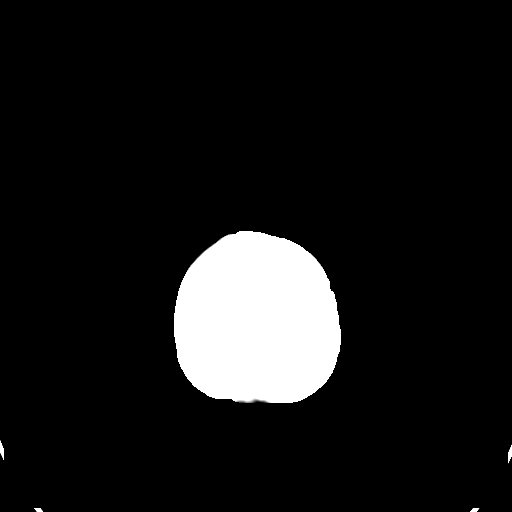

[Series 4: coronal soft tissue · coronal · 0.32mm/px · 3 of 65 slices shown]
[im 22/65  brain]
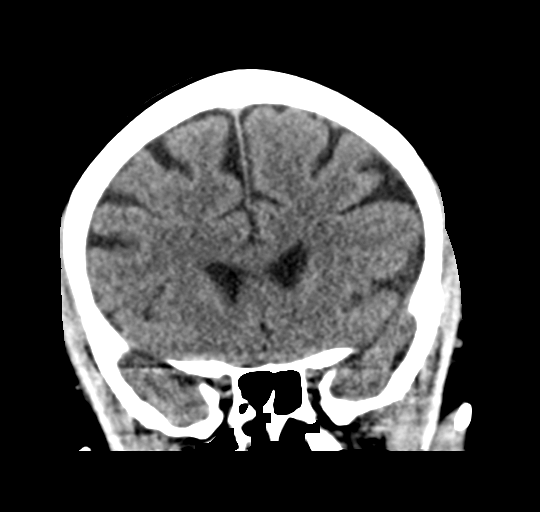
[im 29/65  brain]
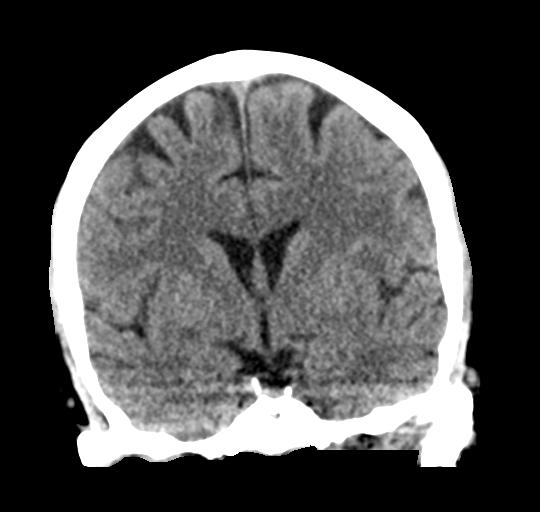
[im 36/65  brain]
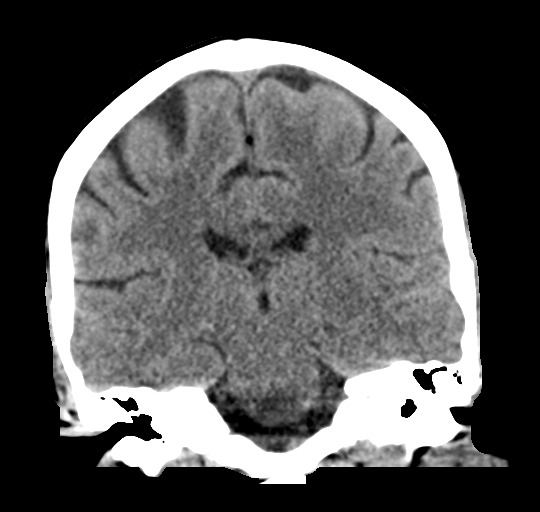

[Series 5: sagittal soft tissue · sagittal · 0.35mm/px · 3 of 53 slices shown]
[im 18/53  brain]
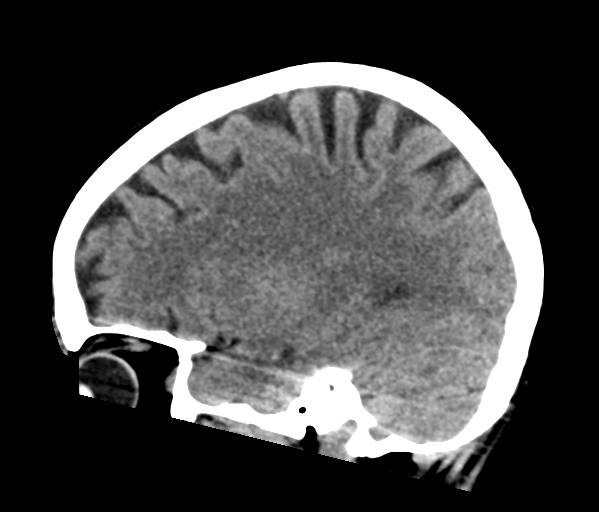
[im 27/53  brain]
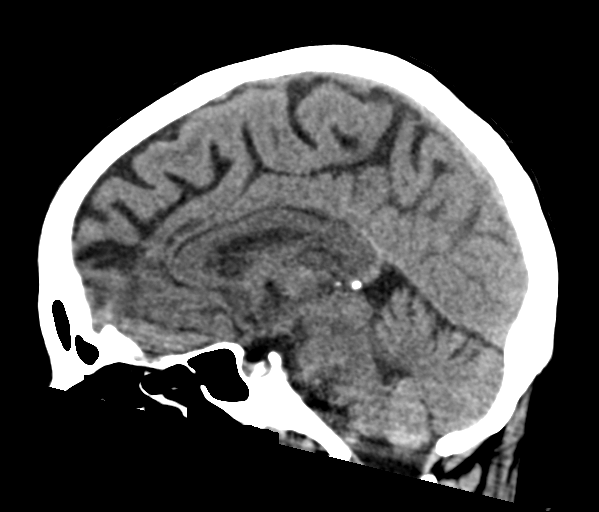
[im 35/53  brain]
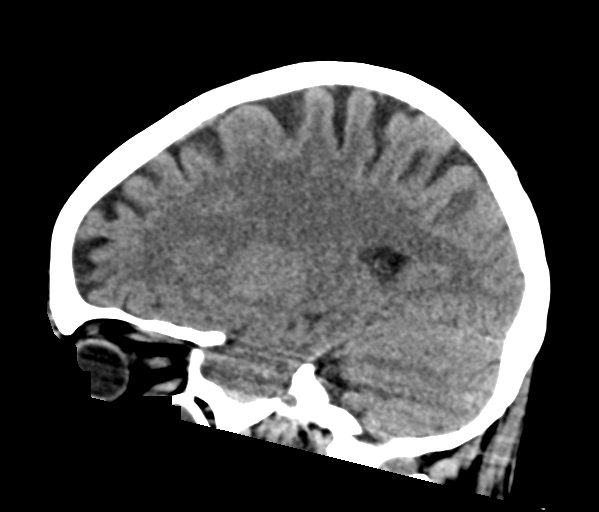

[16 of 47 positions shown; findings below may reference images not displayed]

FINDINGS: Brain: Normal ventricular morphology. No midline shift or mass
effect. Normal appearance of brain parenchyma. No intracranial
hemorrhage, mass lesion, evidence of acute infarction, or
extra-axial fluid collection.

Vascular: Mild atherosclerotic calcifications at the internal
carotid arteries at the skullbase.

Skull: Intact

Sinuses/Orbits: Air-fluid level at apex of LEFT maxillary sinus

Other: N/A
IMPRESSION: No acute intracranial abnormalities.

Air-fluid level LEFT maxillary sinus apex question LEFT maxillary
sinusitis.

## 2019-03-29 ENCOUNTER — Encounter (HOSPITAL_COMMUNITY): Admission: RE | Payer: Self-pay | Source: Home / Self Care

## 2019-03-29 ENCOUNTER — Inpatient Hospital Stay (HOSPITAL_COMMUNITY): Admission: RE | Admit: 2019-03-29 | Payer: 59 | Source: Home / Self Care | Admitting: General Surgery

## 2019-03-29 SURGERY — LAPAROSCOPIC ROUX-EN-Y GASTRIC BYPASS WITH UPPER ENDOSCOPY
Anesthesia: General

## 2020-02-17 ENCOUNTER — Ambulatory Visit (INDEPENDENT_AMBULATORY_CARE_PROVIDER_SITE_OTHER): Payer: Self-pay | Admitting: Orthopaedic Surgery

## 2020-02-17 ENCOUNTER — Encounter: Payer: Self-pay | Admitting: Orthopaedic Surgery

## 2020-02-17 ENCOUNTER — Ambulatory Visit (INDEPENDENT_AMBULATORY_CARE_PROVIDER_SITE_OTHER): Payer: Self-pay

## 2020-02-17 ENCOUNTER — Other Ambulatory Visit: Payer: Self-pay

## 2020-02-17 VITALS — Ht 66.0 in | Wt 130.0 lb

## 2020-02-17 DIAGNOSIS — M25511 Pain in right shoulder: Secondary | ICD-10-CM

## 2020-02-17 DIAGNOSIS — M1711 Unilateral primary osteoarthritis, right knee: Secondary | ICD-10-CM

## 2020-02-17 DIAGNOSIS — G8929 Other chronic pain: Secondary | ICD-10-CM

## 2020-02-17 DIAGNOSIS — M75121 Complete rotator cuff tear or rupture of right shoulder, not specified as traumatic: Secondary | ICD-10-CM | POA: Insufficient documentation

## 2020-02-17 DIAGNOSIS — M179 Osteoarthritis of knee, unspecified: Secondary | ICD-10-CM | POA: Insufficient documentation

## 2020-02-17 DIAGNOSIS — M25561 Pain in right knee: Secondary | ICD-10-CM

## 2020-02-17 MED ORDER — METHYLPREDNISOLONE ACETATE 40 MG/ML IJ SUSP
40.0000 mg | INTRAMUSCULAR | Status: AC | PRN
  Administered 2020-02-17: 40 mg via INTRA_ARTICULAR

## 2020-02-17 MED ORDER — LIDOCAINE HCL 1 % IJ SOLN
0.5000 mL | INTRAMUSCULAR | Status: AC | PRN
  Administered 2020-02-17: .5 mL

## 2020-02-17 MED ORDER — BUPIVACAINE HCL 0.25 % IJ SOLN
4.0000 mL | INTRAMUSCULAR | Status: AC | PRN
  Administered 2020-02-17: 4 mL via INTRA_ARTICULAR

## 2020-02-17 NOTE — Progress Notes (Signed)
Office Visit Note   Patient: [name]           Date of Birth: [birth year]           MRN: [id number] Visit Date: 02/17/2020              Requested by: Neale Burly, MD [place],  [place] [address] PCP: Neale Burly, MD   Assessment & Plan: Visit Diagnoses:  1. Chronic right shoulder pain   2. Chronic pain of right knee   3. Unilateral primary osteoarthritis, right knee   4. Complete tear of right rotator cuff, unspecified whether traumatic     Plan: Right knee injection performed with improvement in her symptoms and pain currently. We discussed severe degenerative changes. I will recheck her in 6 weeks. She states she like to consider right total knee arthroplasty possibly in the spring. Post knee arthroscopy we could consider MRI scan of her shoulder. We discussed reverse total shoulder as a possible option. She seems to be functioning fairly well with longstanding rotator cuff tear. Recheck 6 weeks. Follow-Up Instructions: Return in about 6 weeks (around 03/30/2020).   Orders:  Orders Placed This Encounter  Procedures  . Large Joint Inj: R knee  . XR KNEE 3 VIEW RIGHT  . XR Shoulder Right   No orders of the defined types were placed in this encounter.     Procedures: Large Joint Inj: R knee on 02/17/2020 10:20 AM Indications: pain and joint swelling Details: 22 G 1.5 in needle, anterolateral approach  Arthrogram: No  Medications: 40 mg methylPREDNISolone acetate 40 MG/ML; 0.5 mL lidocaine 1 %; 4 mL bupivacaine 0.25 % Outcome: tolerated well, no immediate complications Procedure, treatment alternatives, risks and benefits explained, specific risks discussed. Consent was given by the patient. Immediately prior to procedure a time out was called to verify the correct patient, procedure, equipment, support staff and site/side marked as required. Patient was prepped and draped in the usual sterile fashion.       Clinical Data: No additional  findings.   Subjective: Chief Complaint  Patient presents with  . Right Shoulder - Pain  . Right Knee - Pain    HPI 64 year old female is seen with progressive right knee pain and deformity.  She is also noticed problems with the right shoulder with difficulty lifting her arm above her head and pain with outstretch reaching.  She has problems flexing the back of her hair.  She has been limping progressively due to her right knee and states Aleve and Mobic are no longer effective.  She had previous history of partial meniscectomy years ago.  Her knee has been catching.  She has not had any recent falls.  Patient is here for discussion of any treatment at the request of Dr. Sherrie Sport.    Review of Systems patient had gastric bypass surgery 2010 used to weigh almost 300 pounds now weighs 130.  C-section x2.  Previous hysterectomy hernia surgery without problems.  Patient staffed pack per day smoker currently does not drink but did drink excessively in the past.  Positive hypertension history of bronchitis and asthma.   Objective: Vital Signs: Ht 5\' 6"  (1.676 m)   Wt 130 lb (59 kg)   BMI 20.98 kg/m   Physical Exam Constitutional:      Appearance: She is well-developed.  HENT:     Head: Normocephalic.     Right Ear: External ear normal.     Left Ear: External  ear normal.  Eyes:     Pupils: Pupils are equal, round, and reactive to light.  Neck:     Thyroid: No thyromegaly.     Trachea: No tracheal deviation.  Cardiovascular:     Rate and Rhythm: Normal rate.  Pulmonary:     Effort: Pulmonary effort is normal.  Abdominal:     Palpations: Abdomen is soft.  Skin:    General: Skin is warm and dry.  Neurological:     Mental Status: She is alert and oriented to person, place, and time.  Psychiatric:        Behavior: Behavior normal.     Ortho Exam p right knee has 15 degrees varus deformity medial joint line tenderness medial laxity of the medial collateral ligament. Severe crepitus  with knee extension. She does flex to 100 degrees. Distal pulses palpable. Atient can reach her left arm fully overhead. She has trouble reaching back or head with the right arm. Positive impingement. There is weakness of external and internal rotation and positive drop arm test. Some supraspinatus atrophy no brachial plexus tenderness.  Specialty Comments:  No specialty comments available.  Imaging: XR KNEE 3 VIEW RIGHT  Result Date: 02/17/2020 Standing AP both knees lateral right knee and sunrise patella view right knee demonstrates tricompartmental degenerative changes. Erosive changes medial compartment with 15 degrees varus. Tricompartmental degenerative changes subchondral sclerosis. Impression: Severe right knee osteoarthritis with varus deformity complete loss of medial joint space.  XR Shoulder Right  Result Date: 02/17/2020 3 views right shoulder pain reviewed. This shows high riding humeral head abutting the acromium with mild scalloping of the acromium. Glenohumeral spurring consistent with rotator cuff arthropathy. Impression: X-rays consistent with longstanding rotator cuff tear with secondary osteoarthritis.    PMFS History: Patient Active Problem List   Diagnosis Date Noted  . Unilateral primary osteoarthritis, right knee 02/17/2020  . Complete tear of right rotator cuff 02/17/2020  . Alcohol abuse 07/19/2013  . Alcohol dependence with intoxication ([place]) 07/15/2013  . Major depressive disorder, recurrent episode, severe, without mention of psychotic behavior 07/15/2013  . CHEST PAIN, ATYPICAL 11/30/2009  . ASTHMATIC BRONCHITIS, ACUTE 05/05/2007  . ALLERGIC RHINITIS 05/05/2007  . ASTHMA 05/05/2007  . ESOPHAGEAL REFLUX 05/05/2007  . INSOMNIA 05/05/2007   Past Medical History:  Diagnosis Date  . Alcoholism ([place])   . Anxiety   . Arthritis   . Asthma   . Depression   . GERD (gastroesophageal reflux disease)   . Hypertension     No family history on file.  Past  Surgical History:  Procedure Laterality Date  . CESAREAN SECTION     x 2  . GASTRIC BYPASS    . KNEE ARTHROSCOPY Right   . LAPAROSCOPIC HYSTERECTOMY     Social History   Occupational History  . Not on file  Tobacco Use  . Smoking status: Current Every Day Smoker    Types: Cigarettes  . Smokeless tobacco: Never Used  Substance and Sexual Activity  . Alcohol use: Yes    Alcohol/week: 21.0 standard drinks    Types: 18 Cans of beer, 3 Standard drinks or equivalent per week    Comment: 24 beers, or 2 fifths (10/02/16-none now)  . Drug use: No  . Sexual activity: Yes

## 2020-04-13 ENCOUNTER — Ambulatory Visit (INDEPENDENT_AMBULATORY_CARE_PROVIDER_SITE_OTHER): Payer: Medicare Other | Admitting: Orthopaedic Surgery

## 2020-04-13 ENCOUNTER — Encounter: Payer: Self-pay | Admitting: Orthopaedic Surgery

## 2020-04-13 VITALS — Ht 66.0 in | Wt 130.0 lb

## 2020-04-13 DIAGNOSIS — M1711 Unilateral primary osteoarthritis, right knee: Secondary | ICD-10-CM | POA: Diagnosis not present

## 2020-04-13 NOTE — Progress Notes (Signed)
Office Visit Note   Patient: [name]           Date of Birth: [birth year]           MRN: [id number] Visit Date: 04/13/2020              Requested by: [name], MD [address] [place],  [place] [address] PCP: [name], MD   Assessment & Plan: Visit Diagnoses:  1. Unilateral primary osteoarthritis, right knee     Plan: We discussed her having a discussion with her daughter that is working about being available to help her postoperatively.  The 2 teenage boys she is raised would be of some help do chores around the house.  We discussed overnight stay operative technique, spinal, Exparel, Marcaine, home physical therapy, outpatient therapy etc.  Her symptoms are progressing and she can return in 2 to 79-month Brien Few discussed this further.  She need to make arrangements with her older daughter to take some time off to assist after surgery.  Follow-Up Instructions: No follow-ups on file.   Orders:  No orders of the defined types were placed in this encounter.  No orders of the defined types were placed in this encounter.     Procedures: No procedures performed   Clinical Data: No additional findings.   Subjective: Chief Complaint  Patient presents with  . Right Knee - Pain    HPI 65 year old female returns with progressive right knee osteoarthritis.  Previous injection in November did not seem to really help.  She has bone-on-bone changes In the right knee medial compartment with progressive varus.  She is amatory with a limp but is still been active.  She is raising her grandchildren age 107 and 34 , 2 boys.  Patient still a [employer] information [employer] manager.  Today she wanted to discuss total knee arthroplasty timing and requirements for therapy and family assistance etc. Review of Systems 14 point system update unchanged.   Objective: Vital Signs: Ht 5\' 6"  (1.676 m)   Wt 130 lb (59 kg)   BMI 20.98 kg/m   Physical Exam Constitutional:      Appearance: She is  well-developed.  HENT:     Head: Normocephalic.     Right Ear: External ear normal.     Left Ear: External ear normal.  Eyes:     Pupils: Pupils are equal, round, and reactive to light.  Neck:     Thyroid: No thyromegaly.     Trachea: No tracheal deviation.  Cardiovascular:     Rate and Rhythm: Normal rate.  Pulmonary:     Effort: Pulmonary effort is normal.  Abdominal:     Palpations: Abdomen is soft.  Skin:    General: Skin is warm and dry.  Neurological:     Mental Status: She is alert and oriented to person, place, and time.  Psychiatric:        Mood and Affect: Mood and affect normal.        Behavior: Behavior normal.     Ortho Exam patient has knee crepitus 10 degrees varus right knee left knee straight.  Distal pulses are palpable negative logroll of the hips more medial than lateral joint line tenderness.  Pain with patellofemoral loading and quadriceps contracture palpable patellofemoral spurs.  Specialty Comments:  No specialty comments available.  Imaging: No results found.   PMFS History: Patient Active Problem List   Diagnosis Date Noted  . Unilateral primary osteoarthritis, right knee 02/17/2020  .  Complete tear of right rotator cuff 02/17/2020  . Alcohol abuse 07/19/2013  . Alcohol dependence with intoxication ([place]) 07/15/2013  . Major depressive disorder, recurrent episode, severe, without mention of psychotic behavior 07/15/2013  . CHEST PAIN, ATYPICAL 11/30/2009  . ASTHMATIC BRONCHITIS, ACUTE 05/05/2007  . ALLERGIC RHINITIS 05/05/2007  . ASTHMA 05/05/2007  . ESOPHAGEAL REFLUX 05/05/2007  . INSOMNIA 05/05/2007   Past Medical History:  Diagnosis Date  . Alcoholism ([place])   . Anxiety   . Arthritis   . Asthma   . Depression   . GERD (gastroesophageal reflux disease)   . Hypertension     No family history on file.  Past Surgical History:  Procedure Laterality Date  . CESAREAN SECTION     x 2  . GASTRIC BYPASS    . KNEE ARTHROSCOPY Right    . LAPAROSCOPIC HYSTERECTOMY     Social History   Occupational History  . Not on file  Tobacco Use  . Smoking status: Current Every Day Smoker    Types: Cigarettes  . Smokeless tobacco: Never Used  Substance and Sexual Activity  . Alcohol use: Yes    Alcohol/week: 21.0 standard drinks    Types: 18 Cans of beer, 3 Standard drinks or equivalent per week    Comment: 24 beers, or 2 fifths (10/02/16-none now)  . Drug use: No  . Sexual activity: Yes

## 2020-05-04 ENCOUNTER — Encounter: Payer: Self-pay | Admitting: Gastroenterology

## 2020-06-16 ENCOUNTER — Other Ambulatory Visit: Payer: Medicare Other | Admitting: Adult Health

## 2020-06-19 ENCOUNTER — Ambulatory Visit: Payer: Medicare Other | Admitting: Gastroenterology

## 2020-09-29 ENCOUNTER — Telehealth: Payer: Self-pay

## 2020-09-29 NOTE — Telephone Encounter (Signed)
Correction: I called the pt and offered appt with Dr. Johney Frame 10/03/20 @ 9 am as a NEW PT for pre op clearance. Pt accepted appt. I did send a message to [facility] Team as well to be sure the chart is update for Dr. Johney Frame. Pt thanked me for the call and the help today. I do not have notes on hand as the clearance in epic was placed by our [facility] office. I will forward these notes to MD for upcoming appt. Will send message to surgeon's office as FYI pt has NEW PT appt 10/03/20 @ 9 am with Dr. Johney Frame.

## 2020-09-29 NOTE — Telephone Encounter (Signed)
Will send message to scheduling to set up a new pt appt. I will also send a message to our [place] team as well. I will let the surgeon's office know that the pt is going to be a NEW PT with our practice.

## 2020-09-29 NOTE — Telephone Encounter (Signed)
   [facility] Pre-operative Risk Assessment    Patient Name: [name]  DOB: 1955/09/16  MRN: [id number]   Request for surgical clearance:  THIS IS A NEW PATIENT  What type of surgery is being performed? RIGHT KNEE TOTAL KNEE ARTHROPLASTY   When is this surgery scheduled? 10-10-2020   What type of clearance is required (medical clearance vs. Pharmacy clearance to hold med vs. Both)? MEDICAL  Are there any medications that need to be held prior to surgery and how long?NONE   Practice name and name of physician performing surgery? [facility] DR Maureen Ralphs   What is the office phone number? [phone]   7.   What is the office fax number? [phone]  8.   Anesthesia type (None, local, MAC, general) ? NOT LISTED

## 2020-09-29 NOTE — Telephone Encounter (Signed)
Primary Cardiologist:None  Chart reviewed as part of pre-operative protocol coverage. Because of [place] past medical history and time since last visit, he/she will require a follow-up visit in order to better assess preoperative cardiovascular risk.  Patient has not yet seen cardiology.  Pre-op covering staff: - Please schedule appointment and call patient to inform them. - Please contact requesting surgeon's office via preferred method (i.e, phone, fax) to inform them of need for appointment prior to surgery.  If applicable, this message will also be routed to pharmacy pool and/or primary cardiologist for input on holding anticoagulant/antiplatelet agent as requested below so that this information is available at time of patient's appointment.   Deberah Pelton, NP  09/29/2020, 1:25 PM

## 2020-10-01 NOTE — Progress Notes (Signed)
Cardiology Office Note:    Date:  10/03/2020   ID:  [name], DOB [birth year], MRN [id number]  PCP:  Neale Burly, MD   [facility] Providers Cardiologist:  None {    Referring MD: Neale Burly, MD    History of Present Illness:    [name] is a 65 y.o. female with a hx of anxiety, depression, asthma, HTN who was referred by Dr. Sherrie Sport for pre-operative evaluation prior to right TKA.  Today, the patient states she overall feels well. She is active and is able to walk 1 mile and walk a flight of stairs without symptoms. No chest pain, palpitations, LE edema, or PND. No known cardiac history. Blood pressure is well controlled on lisinopril. 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  Past Medical History:  Diagnosis Date   Alcoholism ([place])    Anxiety    Arthritis    Asthma    Depression    GERD (gastroesophageal reflux disease)    Hypertension     Past Surgical History:  Procedure Laterality Date   CESAREAN SECTION     x 2   GASTRIC BYPASS     KNEE ARTHROSCOPY Right    LAPAROSCOPIC HYSTERECTOMY      Current Medications: Current Meds  Medication Sig   albuterol (PROVENTIL HFA;VENTOLIN HFA) 108 (90 BASE) MCG/ACT inhaler Inhale 2 puffs into the lungs every 6 (six) hours as needed for wheezing or shortness of breath.   gabapentin (NEURONTIN) 300 MG capsule Take 300 mg by mouth at bedtime.   hydrochlorothiazide (MICROZIDE) 12.5 MG capsule Take 12.5 mg by mouth daily.   lisinopril (ZESTRIL) 2.5 MG tablet Take 2.5 mg by mouth daily.   meloxicam (MOBIC) 15 MG tablet Take 15 mg by mouth daily.   naproxen sodium (ANAPROX) 220 MG tablet Take 440 mg by mouth 2 (two) times daily with a meal.   omeprazole (PRILOSEC) 20 MG capsule Take 1 capsule (20 mg total) by mouth every morning.   tizanidine (ZANAFLEX) 2 MG capsule Take 2 mg by mouth at bedtime.   traZODone (DESYREL) 100 MG tablet Take 100 mg by mouth at bedtime.     Allergies:   Atorvastatin and Morphine    Social History   Socioeconomic History   Marital status: Married    Spouse name: Not on file   Number of children: Not on file   Years of education: Not on file   Highest education level: Not on file  Occupational History   Not on file  Tobacco Use   Smoking status: Every Day    Pack years: 0.00    Types: Cigarettes   Smokeless tobacco: Never  Substance and Sexual Activity   Alcohol use: Yes    Alcohol/week: 21.0 standard drinks    Types: 18 Cans of beer, 3 Standard drinks or equivalent per week    Comment: 24 beers, or 2 fifths (10/02/16-none now)   Drug use: No   Sexual activity: Yes  Other Topics Concern   Not on file  Social History Narrative   Not on file   Social Determinants of Health   Financial Resource Strain: Not on file  Food Insecurity: Not on file  Transportation Needs: Not on file  Physical Activity: Not on file  Stress: Not on file  Social Connections: Not on file     Family History: The patient's family history is not on file.  ROS:   Please see the history of present illness.  Review of Systems  Constitutional:  Negative for chills and fever.  HENT:  Negative for sore throat.   Eyes:  Negative for redness.  Respiratory:  Negative for shortness of breath.   Cardiovascular:  Negative for chest pain, palpitations, orthopnea, claudication, leg swelling and PND.  Gastrointestinal:  Negative for nausea and vomiting.  Genitourinary:  Negative for dysuria and flank pain.  Musculoskeletal:  Positive for joint pain and myalgias.  Neurological:  Negative for dizziness and loss of consciousness.  Psychiatric/Behavioral:  Negative for substance abuse.     EKGs/Labs/Other Studies Reviewed:    The following studies were reviewed today: No cardiac testing  EKG:  EKG is  ordered today.  The ekg ordered today demonstrates NSR with HR 63bpm  Recent Labs: No results found for requested labs within last 8760 hours.  Recent Lipid Panel No results found  for: CHOL, TRIG, HDL, CHOLHDL, VLDL, LDLCALC, LDLDIRECT        Physical Exam:    VS:  BP 118/62   Pulse 63   Ht 5\' 6"  (1.676 m)   Wt 145 lb (65.8 kg)   SpO2 95%   BMI 23.40 kg/m     Wt Readings from Last 3 Encounters:  10/03/20 145 lb (65.8 kg)  04/13/20 130 lb (59 kg)  02/17/20 130 lb (59 kg)     GEN:  Well nourished, well developed in no acute distress HEENT: Normal NECK: No JVD; No carotid bruits LYMPHATICS: No lymphadenopathy CARDIAC: RRR, 2/6 early peaking systolic murmur best heard at RUSB without radiation, no rubs or gallops RESPIRATORY:  Clear to auscultation without rales, wheezing or rhonchi  ABDOMEN: Soft, non-tender, non-distended MUSCULOSKELETAL:  No edema; No deformity  SKIN: Warm and dry NEUROLOGIC:  Alert and oriented x 3 PSYCHIATRIC:  Normal affect   ASSESSMENT:    1. Pre-operative clearance   2. Murmur   3. Primary hypertension    PLAN:    In order of problems listed above:  #Preoperative Evaluation Prior to Right Knee Arthroplasty: Patient is overall active and able to perform >4METs without anginal symptoms. Revised cardiac risk index 0 with 3.9% 30 day risk of death, MI, or cardiac arrest.  No stress testing indicated at this time. Okay to proceed with orthopedic surgery from a CV standpoint. -No stress testing needed prior to surgery  #HTN: Well controlled and at a goal of <120s/80s. -Continue lisinopril 2.5mg  daily  #Systolic Murmur: Soft, early peaking murmur on exam likely aortic sclerosis vs mild stenosis. Very low suspicion for significant valve narrowing. Patient is asymptomatic and active with no anginal or HF symptoms. Will obtain TTE. -Check TTE     Medication Adjustments/Labs and Tests Ordered: Current medicines are reviewed at length with the patient today.  Concerns regarding medicines are outlined above.  Orders Placed This Encounter  Procedures   EKG 12-Lead   ECHOCARDIOGRAM COMPLETE   No orders of the defined types  were placed in this encounter.   Patient Instructions  Medication Instructions:   Your physician recommends that you continue on your current medications as directed. Please refer to the Current Medication list given to you today.  *If you need a refill on your cardiac medications before your next appointment, please call your pharmacy*   Testing/Procedures:  Your physician has requested that you have an echocardiogram. Echocardiography is a painless test that uses sound waves to create images of your heart. It provides your doctor with information about the size and shape of your heart and how  well your heart's chambers and valves are working. This procedure takes approximately one hour. There are no restrictions for this procedure.  TODAY 6/28 AT 3:50 PM HERE AT OUR OFFICE   Follow-Up:  AS NEEDED WITH DR. Johney Frame IN THE OFFICE   Signed, Freada Bergeron, MD  10/03/2020 9:34 AM    [place]

## 2020-10-03 ENCOUNTER — Encounter: Payer: Self-pay | Admitting: Cardiology

## 2020-10-03 ENCOUNTER — Ambulatory Visit (HOSPITAL_COMMUNITY): Payer: Medicare Other | Attending: Internal Medicine

## 2020-10-03 ENCOUNTER — Other Ambulatory Visit: Payer: Self-pay

## 2020-10-03 ENCOUNTER — Ambulatory Visit (INDEPENDENT_AMBULATORY_CARE_PROVIDER_SITE_OTHER): Payer: Medicare Other | Admitting: Cardiology

## 2020-10-03 VITALS — BP 118/62 | HR 63 | Ht 66.0 in | Wt 145.0 lb

## 2020-10-03 DIAGNOSIS — R011 Cardiac murmur, unspecified: Secondary | ICD-10-CM | POA: Diagnosis not present

## 2020-10-03 DIAGNOSIS — Z01810 Encounter for preprocedural cardiovascular examination: Secondary | ICD-10-CM

## 2020-10-03 DIAGNOSIS — Z01818 Encounter for other preprocedural examination: Secondary | ICD-10-CM | POA: Insufficient documentation

## 2020-10-03 DIAGNOSIS — I10 Essential (primary) hypertension: Secondary | ICD-10-CM

## 2020-10-03 LAB — ECHOCARDIOGRAM COMPLETE
AR max vel: 1.29 cm2
AV Area VTI: 1.29 cm2
AV Area mean vel: 1.28 cm2
AV Mean grad: 21 mmHg
AV Peak grad: 31.8 mmHg
Ao pk vel: 2.82 m/s
Area-P 1/2: 2.39 cm2
Height: 66 in
S' Lateral: 2.5 cm
Weight: 2320 oz

## 2020-10-03 NOTE — Patient Instructions (Signed)
Medication Instructions:   Your physician recommends that you continue on your current medications as directed. Please refer to the Current Medication list given to you today.  *If you need a refill on your cardiac medications before your next appointment, please call your pharmacy*   Testing/Procedures:  Your physician has requested that you have an echocardiogram. Echocardiography is a painless test that uses sound waves to create images of your heart. It provides your doctor with information about the size and shape of your heart and how well your heart's chambers and valves are working. This procedure takes approximately one hour. There are no restrictions for this procedure.  TODAY 6/28 AT 3:50 PM HERE AT OUR OFFICE   Follow-Up:  AS NEEDED WITH DR. Johney Frame IN THE OFFICE

## 2020-10-04 ENCOUNTER — Telehealth: Payer: Self-pay | Admitting: *Deleted

## 2020-10-04 ENCOUNTER — Other Ambulatory Visit (HOSPITAL_COMMUNITY): Payer: Medicare Other

## 2020-10-04 DIAGNOSIS — I350 Nonrheumatic aortic (valve) stenosis: Secondary | ICD-10-CM

## 2020-10-04 DIAGNOSIS — I340 Nonrheumatic mitral (valve) insufficiency: Secondary | ICD-10-CM

## 2020-10-04 NOTE — Telephone Encounter (Signed)
-----   Message from Freada Bergeron, MD sent at 10/04/2020  1:18 PM EDT ----- Echo shows normal pumping function. She has mild narrowing of the aortic valve as suspected and mild leakiness of the mitral valve. We will monitor these closely with repeat echo in 2 years. No changes in medications needed and she can follow-up yearly with me. No further work-up needed prior to OR.

## 2020-10-04 NOTE — Telephone Encounter (Signed)
Pt aware of echo results and recommendations per Dr. Johney Frame.  Pt is aware she will need an echo again in 2 years and to follow-up with [place] yearly. Order for repeat echo in 2 years placed in the system and will send a message to our echo scheduler to recall this, and arrange closer to that time.  Pt aware and agrees with this.  Recall for yearly follow-up with Dr. Johney Frame placed in the system.  Pt verbalized understanding and agrees with this plan.

## 2020-10-23 ENCOUNTER — Encounter (INDEPENDENT_AMBULATORY_CARE_PROVIDER_SITE_OTHER): Payer: Self-pay | Admitting: *Deleted

## 2020-12-18 ENCOUNTER — Telehealth (INDEPENDENT_AMBULATORY_CARE_PROVIDER_SITE_OTHER): Payer: Self-pay

## 2020-12-18 ENCOUNTER — Other Ambulatory Visit (INDEPENDENT_AMBULATORY_CARE_PROVIDER_SITE_OTHER): Payer: Self-pay

## 2020-12-18 ENCOUNTER — Encounter (INDEPENDENT_AMBULATORY_CARE_PROVIDER_SITE_OTHER): Payer: Self-pay

## 2020-12-18 DIAGNOSIS — Z1211 Encounter for screening for malignant neoplasm of colon: Secondary | ICD-10-CM

## 2020-12-18 DIAGNOSIS — I10 Essential (primary) hypertension: Secondary | ICD-10-CM

## 2020-12-18 MED ORDER — PEG 3350-KCL-NA BICARB-NACL 420 G PO SOLR: 4000.0000 mL | ORAL | 0 refills | Status: DC

## 2020-12-18 NOTE — Telephone Encounter (Signed)
Referring MD/PCP: Hasanaj  Procedure: Tcs  Reason/Indication:  Screening  Has patient had this procedure before?  yes  If so, when, by whom and where?  Over 10 yrs ago  Is there a family history of colon cancer?  no  Who?  What age when diagnosed?    Is patient diabetic? If yes, Type 1 or Type 2   no      Does patient have prosthetic heart valve or mechanical valve?  no  Do you have a pacemaker/defibrillator?  no  Has patient ever had endocarditis/atrial fibrillation? no  Does patient use oxygen? no  Has patient had joint replacement within last 12 months?  no  Is patient constipated or do they take laxatives? no  Does patient have a history of alcohol/drug use?  no  Have you had a stroke/heart attack last 6 mths? no  Do you take medicine for weight loss?  no  For female patients,: do you still have your menstrual cycle? no  Is patient on blood thinner such as Coumadin, Plavix and/or Aspirin? no  Medications: omeprazole 20 mg daily, hctz 12.5 mg daily, lissinopril 2.'5mg'$  daily, naproxen 220 mg daily, Mobic 15 mg daily, gabapentin 300 mg tid, tizanidine 2 mg daily, ibuprofen 800 mg daily  Allergies: All statin drugs, morphine  Medication Adjustment per Dr Jenetta Downer none  Procedure date & time: 01/17/21 1:50

## 2020-12-18 NOTE — Telephone Encounter (Signed)
LeighAnn Carmaleta Youngers, CMA  

## 2021-01-02 ENCOUNTER — Telehealth (INDEPENDENT_AMBULATORY_CARE_PROVIDER_SITE_OTHER): Payer: Self-pay | Admitting: *Deleted

## 2021-01-02 ENCOUNTER — Other Ambulatory Visit (INDEPENDENT_AMBULATORY_CARE_PROVIDER_SITE_OTHER): Payer: Self-pay

## 2021-01-02 DIAGNOSIS — Z1211 Encounter for screening for malignant neoplasm of colon: Secondary | ICD-10-CM

## 2021-01-02 DIAGNOSIS — D509 Iron deficiency anemia, unspecified: Secondary | ICD-10-CM

## 2021-01-02 NOTE — Telephone Encounter (Signed)
Per dr Laural Golden - pt having colonoscopy on the 12th and he would like to add EGD for IDA on the same day.

## 2021-01-04 NOTE — Telephone Encounter (Signed)
Ok to schedule.  Thanks,  Josseline Reddin Castaneda Mayorga, MD Gastroenterology and Hepatology [facility]  

## 2021-01-15 ENCOUNTER — Other Ambulatory Visit (HOSPITAL_COMMUNITY)
Admission: RE | Admit: 2021-01-15 | Discharge: 2021-01-15 | Disposition: A | Discharge status: Home / Self Care | Payer: Medicare Other | Source: Ambulatory Visit | Attending: Gastroenterology | Admitting: Gastroenterology

## 2021-01-15 DIAGNOSIS — Z1211 Encounter for screening for malignant neoplasm of colon: Secondary | ICD-10-CM | POA: Diagnosis present

## 2021-01-15 DIAGNOSIS — I10 Essential (primary) hypertension: Secondary | ICD-10-CM | POA: Diagnosis present

## 2021-01-15 LAB — BASIC METABOLIC PANEL
Anion gap: 8 (ref 5–15)
BUN: 19 mg/dL (ref 8–23)
CO2: 28 mmol/L (ref 22–32)
Calcium: 8.6 mg/dL — ABNORMAL LOW (ref 8.9–10.3)
Chloride: 100 mmol/L (ref 98–111)
Creatinine, Ser: 0.57 mg/dL (ref 0.44–1.00)
GFR, Estimated: >60 mL/min (ref >60)
Glucose, Bld: 95 mg/dL (ref 70–99)
Potassium: 3.9 mmol/L (ref 3.5–5.1)
Sodium: 136 mmol/L (ref 135–145)

## 2021-01-17 ENCOUNTER — Other Ambulatory Visit: Payer: Self-pay

## 2021-01-17 ENCOUNTER — Ambulatory Visit (HOSPITAL_COMMUNITY): Payer: Medicare Other | Admitting: Anesthesiology

## 2021-01-17 ENCOUNTER — Ambulatory Visit (HOSPITAL_COMMUNITY)
Admission: RE | Admit: 2021-01-17 | Discharge: 2021-01-17 | Disposition: A | Discharge status: Home / Self Care | Payer: Medicare Other | Attending: Gastroenterology | Admitting: Gastroenterology

## 2021-01-17 ENCOUNTER — Encounter (HOSPITAL_COMMUNITY): Payer: Self-pay | Admitting: Gastroenterology

## 2021-01-17 ENCOUNTER — Encounter (HOSPITAL_COMMUNITY)
Admission: RE | Disposition: A | Discharge status: Home / Self Care | Payer: Self-pay | Source: Home / Self Care | Attending: Gastroenterology

## 2021-01-17 DIAGNOSIS — Z885 Allergy status to narcotic agent status: Secondary | ICD-10-CM | POA: Diagnosis not present

## 2021-01-17 DIAGNOSIS — K219 Gastro-esophageal reflux disease without esophagitis: Secondary | ICD-10-CM | POA: Diagnosis not present

## 2021-01-17 DIAGNOSIS — F419 Anxiety disorder, unspecified: Secondary | ICD-10-CM | POA: Insufficient documentation

## 2021-01-17 DIAGNOSIS — J45909 Unspecified asthma, uncomplicated: Secondary | ICD-10-CM | POA: Insufficient documentation

## 2021-01-17 DIAGNOSIS — Z1211 Encounter for screening for malignant neoplasm of colon: Secondary | ICD-10-CM

## 2021-01-17 DIAGNOSIS — M278 Other specified diseases of jaws: Secondary | ICD-10-CM | POA: Diagnosis not present

## 2021-01-17 DIAGNOSIS — D649 Anemia, unspecified: Secondary | ICD-10-CM

## 2021-01-17 DIAGNOSIS — K648 Other hemorrhoids: Secondary | ICD-10-CM | POA: Insufficient documentation

## 2021-01-17 DIAGNOSIS — Z9884 Bariatric surgery status: Secondary | ICD-10-CM | POA: Diagnosis not present

## 2021-01-17 DIAGNOSIS — Z888 Allergy status to other drugs, medicaments and biological substances status: Secondary | ICD-10-CM | POA: Diagnosis not present

## 2021-01-17 DIAGNOSIS — I10 Essential (primary) hypertension: Secondary | ICD-10-CM | POA: Diagnosis not present

## 2021-01-17 DIAGNOSIS — K449 Diaphragmatic hernia without obstruction or gangrene: Secondary | ICD-10-CM | POA: Diagnosis not present

## 2021-01-17 DIAGNOSIS — Z791 Long term (current) use of non-steroidal anti-inflammatories (NSAID): Secondary | ICD-10-CM | POA: Insufficient documentation

## 2021-01-17 DIAGNOSIS — F17210 Nicotine dependence, cigarettes, uncomplicated: Secondary | ICD-10-CM | POA: Diagnosis not present

## 2021-01-17 DIAGNOSIS — F32A Depression, unspecified: Secondary | ICD-10-CM | POA: Diagnosis not present

## 2021-01-17 DIAGNOSIS — D123 Benign neoplasm of transverse colon: Secondary | ICD-10-CM | POA: Diagnosis not present

## 2021-01-17 DIAGNOSIS — D509 Iron deficiency anemia, unspecified: Secondary | ICD-10-CM

## 2021-01-17 DIAGNOSIS — K289 Gastrojejunal ulcer, unspecified as acute or chronic, without hemorrhage or perforation: Secondary | ICD-10-CM | POA: Diagnosis not present

## 2021-01-17 HISTORY — PX: POLYPECTOMY: SHX5525

## 2021-01-17 HISTORY — PX: COLONOSCOPY WITH PROPOFOL: SHX5780

## 2021-01-17 HISTORY — PX: ESOPHAGOGASTRODUODENOSCOPY (EGD) WITH PROPOFOL: SHX5813

## 2021-01-17 LAB — HM COLONOSCOPY

## 2021-01-17 SURGERY — COLONOSCOPY WITH PROPOFOL
Anesthesia: General

## 2021-01-17 MED ORDER — OMEPRAZOLE 40 MG PO CPDR: 40.0000 mg | DELAYED_RELEASE_CAPSULE | Freq: Two times a day (BID) | ORAL | 0 refills | Status: AC

## 2021-01-17 MED ORDER — STERILE WATER FOR IRRIGATION IR SOLN
Status: DC | PRN
  Administered 2021-01-17: 300 mL

## 2021-01-17 MED ORDER — PHENYLEPHRINE HCL (PRESSORS) 10 MG/ML IV SOLN
INTRAVENOUS | Status: DC | PRN
  Administered 2021-01-17 (×2): 100 ug via INTRAVENOUS

## 2021-01-17 MED ORDER — PROPOFOL 500 MG/50ML IV EMUL
INTRAVENOUS | Status: DC | PRN
  Administered 2021-01-17: 200 ug/kg/min via INTRAVENOUS

## 2021-01-17 MED ORDER — PROPOFOL 10 MG/ML IV BOLUS
INTRAVENOUS | Status: DC | PRN
  Administered 2021-01-17: 100 mg via INTRAVENOUS

## 2021-01-17 MED ORDER — LACTATED RINGERS IV SOLN: INTRAVENOUS | Status: DC

## 2021-01-17 NOTE — Anesthesia Preprocedure Evaluation (Addendum)
Anesthesia Evaluation  Patient identified by MRN, date of birth, ID band Patient awake    Reviewed: Allergy & Precautions, NPO status , Patient's Chart, lab work & pertinent test results  History of Anesthesia Complications Negative for: history of anesthetic complications  Airway Mallampati: II  TM Distance: >3 FB Neck ROM: Full    Dental  (+) Dental Advisory Given,  Crowns, veneers:   Pulmonary asthma , Current SmokerPatient did not abstain from smoking.,    Pulmonary exam normal breath sounds clear to auscultation       Cardiovascular hypertension, Pt. on medications Normal cardiovascular exam Rhythm:Regular Rate:Normal     Neuro/Psych PSYCHIATRIC DISORDERS Anxiety Depression    GI/Hepatic GERD  Medicated and Controlled,(+)     substance abuse (alcohol abuse, quit 2019)  alcohol use,   Endo/Other  negative endocrine ROS  Renal/GU negative Renal ROS     Musculoskeletal  (+) Arthritis ,   Abdominal   Peds  Hematology negative hematology ROS (+)   Anesthesia Other Findings   Reproductive/Obstetrics                           Anesthesia Physical Anesthesia Plan  ASA: 2  Anesthesia Plan: General   Post-op Pain Management:    Induction: Intravenous  PONV Risk Score and Plan: TIVA  Airway Management Planned: Nasal Cannula and Natural Airway  Additional Equipment:   Intra-op Plan:   Post-operative Plan:   Informed Consent: I have reviewed the patients History and Physical, chart, labs and discussed the procedure including the risks, benefits and alternatives for the proposed anesthesia with the patient or authorized representative who has indicated his/her understanding and acceptance.     Dental advisory given  Plan Discussed with: CRNA and Surgeon  Anesthesia Plan Comments:        Anesthesia Quick Evaluation

## 2021-01-17 NOTE — Op Note (Signed)
[facility] Patient Name: [place] Procedure Date: 01/17/2021 1:37 PM MRN: [id number] Date of Birth: [birth year] Attending MD: Maylon Peppers ,  CSN: [id number] Age: 65 Admit Type: Outpatient Procedure:                Colonoscopy Indications:              Screening for colorectal malignant neoplasm Providers:                Maylon Peppers, Herbert Pun,                            Technician Referring MD:              Medicines:                Monitored Anesthesia Care Complications:            No immediate complications. Estimated Blood Loss:     Estimated blood loss: none. Procedure:                Pre-Anesthesia Assessment:                           - Prior to the procedure, a History and Physical                            was performed, and patient medications, allergies                            and sensitivities were reviewed. The patient's                            tolerance of previous anesthesia was reviewed.                           - The risks and benefits of the procedure and the                            sedation options and risks were discussed with the                            patient. All questions were answered and informed                            consent was obtained.                           - ASA Grade Assessment: II - A patient with mild                            systemic disease.                           After obtaining informed consent, the colonoscope                            was passed under direct vision. Throughout the  procedure, the patient's blood pressure, pulse, and                            oxygen saturations were monitored continuously. The                            [id number]-HQ190L ([id number]) scope was introduced through                            the anus and advanced to the the cecum, identified                            by appendiceal orifice and ileocecal valve. The                             colonoscopy was performed without difficulty. The                            patient tolerated the procedure well. The quality                            of the bowel preparation was adequate. Scope In: 1:41:53 PM Scope Out: 2:13:34 PM Scope Withdrawal Time: 0 hours 18 minutes 23 seconds  Total Procedure Duration: 0 hours 31 minutes 41 seconds  Findings:      The perianal and digital rectal examinations were normal.      A 8 mm polyp was found in the transverse colon. The polyp was sessile.       The polyp was removed with a cold snare. Resection and retrieval were       complete.      Non-bleeding internal hemorrhoids were found during retroflexion. The       hemorrhoids were small. Impression:               - One 8 mm polyp in the transverse colon, removed                            with a cold snare. Resected and retrieved.                           - Non-bleeding internal hemorrhoids. Moderate Sedation:      Per Anesthesia Care Recommendation:           - Discharge patient to home (ambulatory).                           - Resume previous diet.                           - Await pathology results.                           - Repeat colonoscopy for surveillance based on                            pathology results. Procedure Code(s):        ---  Professional ---                           [phone], Colonoscopy, flexible; with removal of                            tumor(s), polyp(s), or other lesion(s) by snare                            technique Diagnosis Code(s):        --- Professional ---                           Z12.11, Encounter for screening for malignant                            neoplasm of colon                           K63.5, Polyp of colon                           K64.8, Other hemorrhoids CPT copyright 2019 American Medical Association. All rights reserved. The codes documented in this report are preliminary and upon coder review may  be revised to meet  current compliance requirements. Maylon Peppers, MD Maylon Peppers,  01/17/2021 2:22:38 PM This report has been signed electronically. Number of Addenda: 0

## 2021-01-17 NOTE — Op Note (Addendum)
[facility] Patient Name: [place] Procedure Date: 01/17/2021 1:14 PM MRN: [id number] Date of Birth: 17-Jul-1955 Attending MD: Maylon Peppers ,  CSN: [id number] Age: 65 Admit Type: Outpatient Procedure:                Upper GI endoscopy Indications:              Anemia Providers:                Maylon Peppers, Herbert Pun,                            Technician Referring MD:              Medicines:                Monitored Anesthesia Care Complications:            No immediate complications. Estimated Blood Loss:     Estimated blood loss: none. Procedure:                Pre-Anesthesia Assessment:                           - Prior to the procedure, a History and Physical                            was performed, and patient medications, allergies                            and sensitivities were reviewed. The patient's                            tolerance of previous anesthesia was reviewed.                           - The risks and benefits of the procedure and the                            sedation options and risks were discussed with the                            patient. All questions were answered and informed                            consent was obtained.                           - ASA Grade Assessment: II - A patient with mild                            systemic disease.                           After obtaining informed consent, the endoscope was                            passed under direct vision. Throughout the  procedure, the patient's blood pressure, pulse, and                            oxygen saturations were monitored continuously. The                            [id number]-[id number] ([id number]) scope was introduced through the                            mouth, and advanced to the jejunum. The upper GI                            endoscopy was accomplished without difficulty. The                            patient  tolerated the procedure well. Scope In: 1:32:16 PM Scope Out: 1:36:01 PM Total Procedure Duration: 0 hours 3 minutes 45 seconds  Findings:      A nodule was found in the hard palate.      A 2 cm hiatal hernia was present. The exam of the esophagus was       otherwise normal.      Evidence of a gastric bypass was found. A gastric pouch with a 6 cm       length from the GE junction to the gastrojejunal anastomosis was found.       The staple line appeared intact. The gastrojejunal anastomosis was       characterized by a two marginal ulcerations, with max diameter of 10 mm.       The ulcers had a clean base and no stigmata of recent bleeding. This was       traversed. The pouch-to-jejunum limb was characterized by healthy       appearing mucosa.      The examined jejunum was normal. Impression:               - A nodule was found in the hard palate.                           - 2 cm hiatal hernia.                           - Gastric bypass with a pouch 6 cm in length and                            intact staple line. 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                           - Normal examined jejunum.                           - No specimens collected. Moderate Sedation:      Per Anesthesia Care Recommendation:           - Discharge patient to home (ambulatory).                           -  Resume previous diet.                           - Increase omeprazole to 40 mg twice a day (open                            capsule and swallow granules)                           - No ibuprofen, naproxen, or other non-steroidal                            anti-inflammatory drugs.                           - Consider evaluation by ENT for nodularity in hard                            palate                           - Check H. pylori serology                           - Smoking cessation Procedure Code(s):        --- Professional ---                            [phone], Esophagogastroduodenoscopy, flexible,                            transoral; diagnostic, including collection of                            specimen(s) by brushing or washing, when performed                            (separate procedure) Diagnosis Code(s):        --- Professional ---                           K44.9, Diaphragmatic hernia without obstruction or                            gangrene                           K28.9, Gastrojejunal ulcer, unspecified as acute or                            chronic, without hemorrhage or perforation                           D64.9, Anemia, unspecified CPT copyright 2019 American Medical Association. All rights reserved. The codes documented in this report are preliminary and upon coder review may  be revised to meet current compliance requirements. Maylon Peppers, MD Maylon Peppers,  01/17/2021 2:20:07 PM This report has  been signed electronically. Number of Addenda: 0

## 2021-01-17 NOTE — Anesthesia Postprocedure Evaluation (Signed)
Anesthesia Post Note  Patient: [name]  Procedure(s) Performed: COLONOSCOPY WITH PROPOFOL ESOPHAGOGASTRODUODENOSCOPY (EGD) WITH PROPOFOL POLYPECTOMY  Patient location during evaluation: Endoscopy Anesthesia Type: General Level of consciousness: awake and alert and oriented Pain management: pain level controlled Vital Signs Assessment: post-procedure vital signs reviewed and stable Respiratory status: spontaneous breathing and respiratory function stable Cardiovascular status: blood pressure returned to baseline and stable Postop Assessment: no apparent nausea or vomiting Anesthetic complications: no   No notable events documented.   Last Vitals:  Vitals:   01/17/21 1238 01/17/21 1417  BP: 104/75 114/62  Pulse: 73   Resp: 16 16  Temp: 36.5 C 36.6 C  SpO2: 98% 98%    Last Pain:  Vitals:   01/17/21 1417  TempSrc: Oral  PainSc: 0-No pain                 Nisha Dhami C Zephaniah Lubrano

## 2021-01-17 NOTE — Discharge Instructions (Addendum)
You are being discharged to home.  Resume your previous diet.  Do not take any ibuprofen (including Advil, Motrin or Nuprin), naproxen, or other non-steroidal anti-inflammatory drugs.  Consider evaluation by ENT for nodularity in hard palate We are waiting for your pathology results.  Your physician has recommended a repeat colonoscopy for surveillance based on pathology results.  Patient was counseled regarding the importance of stopping cigarette smoking. The patient was informed about the long term effects of smoking, progression of current disease. Increase omeprazole to 40 mg twice a day (open capsule and swallow granules)

## 2021-01-17 NOTE — Transfer of Care (Signed)
Immediate Anesthesia Transfer of Care Note  Patient: [name]  Procedure(s) Performed: COLONOSCOPY WITH PROPOFOL ESOPHAGOGASTRODUODENOSCOPY (EGD) WITH PROPOFOL POLYPECTOMY  Patient Location: PACU  Anesthesia Type:General  Level of Consciousness: awake, alert , oriented and patient cooperative  Airway & Oxygen Therapy: Patient Spontanous Breathing  Post-op Assessment: Report given to RN, Post -op Vital signs reviewed and stable and Patient moving all extremities X 4  Post vital signs: Reviewed and stable  Last Vitals:  Vitals Value Taken Time  BP    Temp    Pulse    Resp    SpO2      Last Pain:  Vitals:   01/17/21 1417  TempSrc: Oral  PainSc: 6       Patients Stated Pain Goal: 6 (70/78/67 5449)  Complications: No notable events documented.

## 2021-01-17 NOTE — H&P (Signed)
[name] is an 65 y.o. female.   Chief Complaint: screening colonoscopy and anemia HPI: 65 year old female with past medical history of anxiety, obesity status post gastric bypass, asthma, depression, GERD, hypertension, who comes to the hospital for screening colonoscopy and evaluation of anemia.  The patient denies any complaints.  She reports that she had evidence of anemia while undergoing blood work-up by her PCP.  She reports that she was not iron deficient but she was given 1 dose of IV iron.  Most recent hemoglobin was above 12.  Unfortunately, none of these reports are available.  She denies any melena, hematochezia, abdominal pain, nausea, vomiting, fever, chills, abdominal distention.  Notably, she reports that she has been chronically taking ibuprofen and Aleve every day for knee pain.  Reported her last colonoscopy was performed 10 years ago but no lesions were found, no reports are available.  Past Medical History:  Diagnosis Date   Alcoholism ([place])    Anxiety    Arthritis    Asthma    Depression    GERD (gastroesophageal reflux disease)    Hypertension     Past Surgical History:  Procedure Laterality Date   CESAREAN SECTION     x 2   GASTRIC BYPASS     KNEE ARTHROSCOPY Right    LAPAROSCOPIC HYSTERECTOMY      History reviewed. No pertinent family history. Social History:  reports that she has been smoking cigarettes. She has been smoking an average of 1 pack per day. She has never used smokeless tobacco. She reports current alcohol use of about 21.0 standard drinks per week. She reports that she does not use drugs.  Allergies:  Allergies  Allergen Reactions   Morphine Nausea And Vomiting   Statins     Affected muscles, couldn't move    Medications Prior to Admission  Medication Sig Dispense Refill   albuterol (PROVENTIL HFA;VENTOLIN HFA) 108 (90 BASE) MCG/ACT inhaler Inhale 2 puffs into the lungs every 6 (six) hours as needed for wheezing or shortness of  breath.     gabapentin (NEURONTIN) 300 MG capsule Take 900 mg by mouth at bedtime.     hydrochlorothiazide (MICROZIDE) 12.5 MG capsule Take 12.5 mg by mouth daily.     ibuprofen (ADVIL) 800 MG tablet Take 800 mg by mouth daily.     lisinopril (ZESTRIL) 2.5 MG tablet Take 2.5 mg by mouth daily.     LORazepam (ATIVAN) 0.5 MG tablet Take 0.5 mg by mouth daily as needed for anxiety.     Magnesium 400 MG TABS Take 400 mg by mouth at bedtime.     meloxicam (MOBIC) 15 MG tablet Take 15 mg by mouth daily.  0   Multiple Vitamins-Minerals (ADULT GUMMY PO) Take 2 capsules by mouth daily.     omeprazole (PRILOSEC) 20 MG capsule Take 1 capsule (20 mg total) by mouth every morning.     traZODone (DESYREL) 100 MG tablet Take 100 mg by mouth at bedtime.  0   vitamin B-12 (CYANOCOBALAMIN) 500 MCG tablet Take 500 mcg by mouth daily.     vitamin C (ASCORBIC ACID) 500 MG tablet Take 500 mg by mouth daily.     polyethylene glycol-electrolytes (TRILYTE) 420 g solution Take 4,000 mLs by mouth as directed. 4000 mL 0   tizanidine (ZANAFLEX) 2 MG capsule Take 2 mg by mouth at bedtime.      No results found for this or any previous visit (from the past 48 hour(s)). No results found.  Review of Systems  Constitutional: Negative.   HENT: Negative.    Eyes: Negative.   Respiratory: Negative.    Cardiovascular: Negative.   Gastrointestinal: Negative.   Endocrine: Negative.   Genitourinary: Negative.   Musculoskeletal: Negative.   Skin: Negative.   Allergic/Immunologic: Negative.   Neurological: Negative.   Hematological: Negative.   Psychiatric/Behavioral: Negative.     Blood pressure 104/75, pulse 73, temperature 97.7 F (36.5 C), temperature source Oral, resp. rate 16, height 5\' 6"  (1.676 m), weight 63.5 kg, SpO2 98 %. Physical Exam  GENERAL: The patient is AO x3, in no acute distress. HEENT: Head is normocephalic and atraumatic. EOMI are intact. Mouth is well hydrated and without lesions. NECK: Supple.  No masses LUNGS: Clear to auscultation. No presence of rhonchi/wheezing/rales. Adequate chest expansion HEART: RRR, normal s1 and s2. ABDOMEN: Soft, nontender, no guarding, no peritoneal signs, and nondistended. BS +. No masses. EXTREMITIES: Without any cyanosis, clubbing, rash, lesions or edema. NEUROLOGIC: AOx3, no focal motor deficit. SKIN: no jaundice, no rashes  Assessment/Plan 65 year old female with past medical history of anxiety, obesity status post gastric bypass, asthma, depression, GERD, hypertension, who comes to the hospital for screening colonoscopy and evaluation of anemia.  We will proceed with EGD and colonoscopy.  Harvel Quale, MD 01/17/2021, 12:44 PM

## 2021-01-18 LAB — H. PYLORI ANTIBODY, IGG: H Pylori IgG: 0.13 Index Value (ref 0.00–0.79)

## 2021-01-22 ENCOUNTER — Encounter (HOSPITAL_COMMUNITY): Payer: Self-pay | Admitting: Gastroenterology

## 2021-01-22 LAB — SURGICAL PATHOLOGY

## 2021-01-23 ENCOUNTER — Encounter (INDEPENDENT_AMBULATORY_CARE_PROVIDER_SITE_OTHER): Payer: Self-pay | Admitting: *Deleted

## 2021-10-01 ENCOUNTER — Other Ambulatory Visit: Payer: Self-pay | Admitting: Cardiology

## 2021-10-01 DIAGNOSIS — I350 Nonrheumatic aortic (valve) stenosis: Secondary | ICD-10-CM

## 2021-10-01 DIAGNOSIS — I340 Nonrheumatic mitral (valve) insufficiency: Secondary | ICD-10-CM

## 2022-11-18 ENCOUNTER — Encounter: Payer: Self-pay | Admitting: Oncology

## 2022-11-18 ENCOUNTER — Inpatient Hospital Stay: Payer: Medicare Other | Attending: Oncology | Admitting: Oncology

## 2022-11-18 VITALS — BP 143/78 | HR 67 | Temp 97.2°F | Resp 16 | Ht 66.0 in | Wt 143.8 lb

## 2022-11-18 DIAGNOSIS — F17210 Nicotine dependence, cigarettes, uncomplicated: Secondary | ICD-10-CM | POA: Diagnosis not present

## 2022-11-18 DIAGNOSIS — Z9884 Bariatric surgery status: Secondary | ICD-10-CM | POA: Diagnosis not present

## 2022-11-18 DIAGNOSIS — D509 Iron deficiency anemia, unspecified: Secondary | ICD-10-CM | POA: Insufficient documentation

## 2022-11-18 DIAGNOSIS — Z720 Tobacco use: Secondary | ICD-10-CM | POA: Insufficient documentation

## 2022-11-18 DIAGNOSIS — Z79899 Other long term (current) drug therapy: Secondary | ICD-10-CM | POA: Insufficient documentation

## 2022-11-18 MED ORDER — SODIUM CHLORIDE 0.9 % IV SOLN: 1000.0000 mg | Freq: Once | INTRAVENOUS | Status: DC

## 2022-11-18 NOTE — Assessment & Plan Note (Addendum)
Labs reviewed from primary care Iron sat: 10, ferritin: 11, Hb:10.4 Low normal Iron and TIBC Patient will likely have poor PO absorption from prior history of gastric bypass and patient has failed trial of PO Iron 1 year ago Patient also has poor tolerance to PO Iron causing significant constipation leading to non compliance Lats colonoscopy: 2022, recommended to repeat in 5 years I.e 2027. One sessile serrated polyp seen. Will schedule for IV Iron Repeat labs in 3 months

## 2022-11-18 NOTE — Patient Instructions (Signed)
Will schedule for IV Iron Start taking PO Iron daily Follow-up with GI as scheduled  What is anemia? This is when a person has too few red blood cells or too little hemoglobin in their blood. Hemoglobin is inside red blood cells and helps the cells carry oxygen to all parts of the body. If your hemoglobin level is low, your body might not get all of the oxygen it needs.  What is iron deficiency anemia? Anemia can happen for a few different reasons. A common reason is not having enough iron. This is called "iron deficiency" or sometimes "low iron."  You might not have enough iron if:  ?You have lost a large amount of blood - This can happen slowly over time, or all of a sudden. It is the most common cause of iron deficiency anemia.  Menstrual periods and pregnancy are common reasons to lose blood. In older people, tumors in the intestine can bleed. Sometimes, bleeding happens so slowly that you do not see the blood in your bowel movements.  ?Your body cannot absorb enough iron from food - This can happen if you had surgery on your stomach or intestines. It can also happen if you have a condition like celiac disease that affects your intestines.  ?You do not get enough iron in your food - This can be a problem in infants who do not get enough iron from formula, food, or supplements. It can also happen in parts of the world where people do not get enough iron in their diet.  What are the symptoms of iron deficiency anemia? Some people have no symptoms. People who do have symptoms might:  ?Feel irritable  ?Feel tired or weak, especially if they try to exercise or walk up stairs  ?Have headaches  ?Have chest pain or trouble breathing  ?Have abnormal cravings that make them want to eat ice or substances like clay or wallpaper  ?Have "restless legs syndrome," where the legs feel like they need to keep moving, especially at night  Is there a test for anemia? Yes. Your doctor or nurse can  test your blood for anemia. They most often check your "hemoglobin" and "hematocrit." These are part of a test called a "complete blood count," or "CBC."  If blood tests show that you have anemia, or if you have symptoms of iron deficiency, your doctor or nurse will ask questions and do other blood tests. This will help them figure out what is causing your anemia and how best to treat it.  How is iron deficiency anemia treated? It is treated by giving you extra iron. Eating foods that contain iron is not enough. If your anemia is severe, you might need a blood transfusion. You might also need treatment for the cause of bleeding.  If you need treatment with iron, there are some things to know:  ?Iron comes in pills, or in a liquid you can get through an IV. (An IV is a thin tube that goes into a vein.) Your doctor or nurse will talk with you about which is best for you.  ?Iron pills are taken once every other day or once a day. They need to be taken for several months. In most cases, IV iron can be given in a single treatment or a small number of treatments.  ?Iron pills can cause side effects such as upset stomach and constipation (too few bowel movements, or bowel movements that are hard or painful).  ?Some people cannot get enough  iron from pills. This might be the case if you had weight loss surgery or a condition called inflammatory bowel disease, or if you are pregnant and nearing the end of your pregnancy.  ?If you have side effects or cannot get enough iron from pills, there are things you can do to reduce these side effects, or you might switch to IV iron.  It is also important to find out why your iron was low. If it was caused by blood loss, the cause of bleeding needs to be found. Other causes also have important treatments. For example, if you have heavy menstrual periods, your doctor might do tests to find out why. There are medicines that can make your period lighter or stop it  completely. Follow all instructions about testing and treatment.

## 2022-11-18 NOTE — Progress Notes (Signed)
[facility] at [facility] HEMATOLOGY NEW VISIT  [name], MD  REASON FOR REFERRAL: Anemia   HISTORY OF PRESENT ILLNESS: [name] 67 y.o. female referred for Anemia.  She is in the clinic unaccompanied today.  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 She has complaints of right knee pain that she has been having for several years and is unable to get surgery done because of low hemoglobin.  She also reports being tired for a while now but associates it with working all the time.  She stated that she tried PO Iron 1 year ago had poor tolerance with constipation and had 2 IV Iron infusions at [facility] 1 year ago with improvement in Hb.   We reviewed the labs from primary care and discussed the labs in detail along with interpretation.  Based on chart review, patient had hemoglobin within normal before the gastric bypass and has been lower since then.  Denies SOB, DOE, chest pain, abdominal pain, dizziness, syncope, no blood in stools, melena.   Smokes: 1/2 ppd, 35 pack year smoking history, Alcohol: last drank in 2019 but was a heavy drinker 2017-2019   I have reviewed the past medical history, past surgical history, social history and family history with the patient   ALLERGIES:  is allergic to morphine and statins.  MEDICATIONS:  Current Outpatient Medications  Medication Sig Dispense Refill   albuterol (PROVENTIL HFA;VENTOLIN HFA) 108 (90 BASE) MCG/ACT inhaler Inhale 2 puffs into the lungs every 6 (six) hours as needed for wheezing or shortness of breath.     gabapentin (NEURONTIN) 300 MG capsule Take 900 mg by mouth at bedtime.     hydrochlorothiazide (MICROZIDE) 12.5 MG capsule Take 12.5 mg by mouth daily.     LORazepam (ATIVAN) 0.5 MG tablet Take 0.5 mg by mouth 2 (two) times daily.     meloxicam (MOBIC) 15 MG tablet Take 7.5 mg by mouth daily.     Multiple Vitamins-Minerals (ADULT  GUMMY PO) Take 2 capsules by mouth daily.     omeprazole (PRILOSEC) 40 MG capsule Take 1 capsule (40 mg total) by mouth 2 (two) times daily. 180 capsule 0   traZODone (DESYREL) 100 MG tablet Take 100 mg by mouth at bedtime.  0   vitamin B-12 (CYANOCOBALAMIN) 500 MCG tablet Take 1,000 mcg by mouth daily.     No current facility-administered medications for this visit.     REVIEW OF SYSTEMS:   Constitutional: Denies fevers, chills or night sweats Respiratory: Denies cough, dyspnea or wheezes Cardiovascular: Denies palpitation, chest discomfort or lower extremity swelling Gastrointestinal:  Denies nausea, heartburn or change in bowel habits Skin: Denies abnormal skin rashes Lymphatics: Denies new lymphadenopathy or easy bruising Neurological:Denies numbness, tingling or new weaknesses Behavioral/Psych: Mood is stable, no new changes  All other systems were reviewed with the patient and are negative.  PHYSICAL EXAMINATION:   Vitals:   11/18/22 1010  BP: (!) 143/78  Pulse: 67  Resp: 16  Temp: (!) 97.2 F (36.2 C)  SpO2: 98%    GENERAL:alert, no distress and comfortable LUNGS: clear to auscultation and percussion with normal breathing effort HEART: regular rate & rhythm and no murmurs and no lower extremity edema ABDOMEN:abdomen soft, non-tender and normal bowel sounds MUSCULOSKELETAL:Swollen right knee with a brace on   LABORATORY DATA:  I have reviewed the data as listed and reports from primary care under media  Lab Results  Component  Value Date   WBC 6.8 10/02/2016   NEUTROABS 4.2 10/02/2016   HGB 11.9 (L) 10/02/2016   HCT 37.0 10/02/2016   MCV 85.3 10/02/2016   PLT 207 10/02/2016      Component Value Date/Time   NA 136 01/15/2021 1043   K 3.9 01/15/2021 1043   CL 100 01/15/2021 1043   CO2 28 01/15/2021 1043   GLUCOSE 95 01/15/2021 1043   BUN 19 01/15/2021 1043   CREATININE 0.57 01/15/2021 1043   CALCIUM 8.6 (L) 01/15/2021 1043   PROT 7.0 07/14/2013 1555    ALBUMIN 4.1 07/14/2013 1555   AST 19 07/14/2013 1555   ALT 14 07/14/2013 1555   ALKPHOS 62 07/14/2013 1555   BILITOT 0.3 07/14/2013 1555   GFRNONAA >60 01/15/2021 1043   GFRAA >60 10/02/2016 1325        Chemistry      Component Value Date/Time   NA 136 01/15/2021 1043   K 3.9 01/15/2021 1043   CL 100 01/15/2021 1043   CO2 28 01/15/2021 1043   BUN 19 01/15/2021 1043   CREATININE 0.57 01/15/2021 1043      Component Value Date/Time   CALCIUM 8.6 (L) 01/15/2021 1043   ALKPHOS 62 07/14/2013 1555   AST 19 07/14/2013 1555   ALT 14 07/14/2013 1555   BILITOT 0.3 07/14/2013 1555      ASSESSMENT & PLAN:  Iron deficiency anemia Labs reviewed from primary care Iron sat: 10, ferritin: 11, Hb:10.4 Low normal Iron and TIBC Patient will likely have poor PO absorption from prior history of gastric bypass and patient has failed trial of PO Iron 1 year ago Patient also has poor tolerance to PO Iron causing significant constipation leading to non compliance Lats colonoscopy: 2022, recommended to repeat in 5 years I.e 2027. One sessile serrated polyp seen. Will schedule for IV Iron Repeat labs in 3 months   Tobacco use Patient reports a current tobacco use: smoking 1/2 pack of cigarettes per day 35 pack year history Reports night mares with nicotine patch Reluctant to retry nicotine patch or Chantix Discussed risks of smoking elaborately including cancer and cardiac risk and recommended strongly to quit smoking   Orders Placed This Encounter  Procedures   CBC With Diff/Platelet    Standing Status:   Future    Standing Expiration Date:   11/18/2023   Ferritin    Standing Status:   Future    Standing Expiration Date:   11/18/2023   Iron and TIBC ([facility] ONLY)    Standing Status:   Future    Standing Expiration Date:   11/18/2023    The total time spent in the appointment was 60 minutes encounter with patients including review of chart and various tests  results, discussions about plan of care and coordination of care plan and documentation   All questions were answered. The patient knows to call the clinic with any problems, questions or concerns. No barriers to learning was detected.   Cindie Crumbly, MD 8/12/202412:37 PM

## 2022-11-18 NOTE — Assessment & Plan Note (Signed)
Patient reports a current tobacco use: smoking 1/2 pack of cigarettes per day 35 pack year history Reports night mares with nicotine patch Reluctant to retry nicotine patch or Chantix Discussed risks of smoking elaborately including cancer and cardiac risk and recommended strongly to quit smoking

## 2022-11-19 ENCOUNTER — Other Ambulatory Visit: Payer: Self-pay

## 2022-11-19 DIAGNOSIS — D509 Iron deficiency anemia, unspecified: Secondary | ICD-10-CM

## 2022-11-28 MED FILL — Ferric Derisomaltose (One Dose) IV Sol 1000 MG/10ML (Fe Eq): INTRAVENOUS | Qty: 10 | Status: AC

## 2022-11-29 ENCOUNTER — Inpatient Hospital Stay: Payer: Medicare Other

## 2022-11-29 VITALS — BP 127/69 | HR 71 | Temp 97.5°F | Resp 18

## 2022-11-29 DIAGNOSIS — D509 Iron deficiency anemia, unspecified: Secondary | ICD-10-CM | POA: Diagnosis not present

## 2022-11-29 MED ORDER — ACETAMINOPHEN 325 MG PO TABS
650.0000 mg | ORAL_TABLET | Freq: Once | ORAL | Status: AC
  Administered 2022-11-29: 650 mg via ORAL
  Filled 2022-11-29: qty 2

## 2022-11-29 MED ORDER — SODIUM CHLORIDE 0.9 % IV SOLN: Freq: Once | INTRAVENOUS | Status: AC

## 2022-11-29 MED ORDER — CETIRIZINE HCL 10 MG/ML IV SOLN
10.0000 mg | Freq: Once | INTRAVENOUS | Status: AC
  Administered 2022-11-29: 10 mg via INTRAVENOUS
  Filled 2022-11-29: qty 1

## 2022-11-29 MED ORDER — SODIUM CHLORIDE 0.9 % IV SOLN
1000.0000 mg | Freq: Once | INTRAVENOUS | Status: AC
  Administered 2022-11-29: 1000 mg via INTRAVENOUS
  Filled 2022-11-29: qty 1000

## 2022-11-29 NOTE — Patient Instructions (Signed)
[facility]  Discharge Instructions: Thank you for choosing [facility] to provide your oncology and hematology care.  If you have a lab appointment with the Cancer Center - please note that after April 8th, 2024, all labs will be drawn in the cancer center.  You do not have to check in or register with the main entrance as you have in the past but will complete your check-in in the cancer center.  Wear comfortable clothing and clothing appropriate for easy access to any Portacath or PICC line.   We strive to give you quality time with your provider. You may need to reschedule your appointment if you arrive late (15 or more minutes).  Arriving late affects you and other patients whose appointments are after yours.  Also, if you miss three or more appointments without notifying the office, you may be dismissed from the clinic at the provider's discretion.      For prescription refill requests, have your pharmacy contact our office and allow 72 hours for refills to be completed.    Today you received the following Monoferric infusion.  Ferric Derisomaltose Injection What is this medication? FERRIC DERISOMALTOSE (FER ik der EYE soe MAWL tose) treats low levels of iron in your body (iron deficiency anemia). Iron is a mineral that plays an important role in making red blood cells, which carry oxygen from your lungs to the rest of your body. This medicine may be used for other purposes; ask your health care provider or pharmacist if you have questions. COMMON BRAND NAME(S): MONOFERRIC What should I tell my care team before I take this medication? They need to know if you have any of these conditions: High levels of iron in the blood An unusual or allergic reaction to iron, other medications, foods, dyes, or preservatives Pregnant or trying to get pregnant Breastfeeding How should I use this medication? This medication is injected into a vein. It is given by your  care team in a hospital or clinic setting. Talk to your care team about the use of this medication in children. Special care may be needed. Overdosage: If you think you have taken too much of this medicine contact a poison control center or emergency room at once. NOTE: This medicine is only for you. Do not share this medicine with others. What if I miss a dose? It is important not to miss your dose. Call your care team if you are unable to keep an appointment. What may interact with this medication? Do not take this medication with any of the following: Deferoxamine Dimercaprol Other iron products This list may not describe all possible interactions. Give your health care provider a list of all the medicines, herbs, non-prescription drugs, or dietary supplements you use. Also tell them if you smoke, drink alcohol, or use illegal drugs. Some items may interact with your medicine. What should I watch for while using this medication? Visit your care team regularly. Tell your care team if your symptoms do not start to get better or if they get worse. You may need blood work done while you are taking this medication. You may need to follow a special diet. Talk to your care team. Foods that contain iron include whole grains/cereals, dried fruits, beans, or peas, leafy green vegetables, and organ meats (liver, kidney). What side effects may I notice from receiving this medication? Side effects that you should report to your care team as soon as possible: Allergic reactions--skin rash, itching,  hives, swelling of the face, lips, tongue, or throat Low blood pressure--dizziness, feeling faint or lightheaded, blurry vision Shortness of breath Side effects that usually do not require medical attention (report to your care team if they continue or are bothersome): Flushing Headache Joint pain Muscle pain Nausea Pain, redness, or irritation at injection site This list may not describe all possible side  effects. Call your doctor for medical advice about side effects. You may report side effects to FDA at 1-800-FDA-1088. Where should I keep my medication? This medication is given in a hospital or clinic. It will not be stored at home. NOTE: This sheet is a summary. It may not cover all possible information. If you have questions about this medicine, talk to your doctor, pharmacist, or health care provider.  2024 Elsevier/Gold Standard (2022-08-30 00:00:00)       To help prevent nausea and vomiting after your treatment, we encourage you to take your nausea medication as directed.  BELOW ARE SYMPTOMS THAT SHOULD BE REPORTED IMMEDIATELY: *FEVER GREATER THAN 100.4 F (38 C) OR HIGHER *CHILLS OR SWEATING *NAUSEA AND VOMITING THAT IS NOT CONTROLLED WITH YOUR NAUSEA MEDICATION *UNUSUAL SHORTNESS OF BREATH *UNUSUAL BRUISING OR BLEEDING *URINARY PROBLEMS (pain or burning when urinating, or frequent urination) *BOWEL PROBLEMS (unusual diarrhea, constipation, pain near the anus) TENDERNESS IN MOUTH AND THROAT WITH OR WITHOUT PRESENCE OF ULCERS (sore throat, sores in mouth, or a toothache) UNUSUAL RASH, SWELLING OR PAIN  UNUSUAL VAGINAL DISCHARGE OR ITCHING   Items with * indicate a potential emergency and should be followed up as soon as possible or go to the Emergency Department if any problems should occur.  Please show the CHEMOTHERAPY ALERT CARD or IMMUNOTHERAPY ALERT CARD at check-in to the Emergency Department and triage nurse.  Should you have questions after your visit or need to cancel or reschedule your appointment, please contact [facility] [phone]  and follow the prompts.  Office hours are 8:00 a.m. to 4:30 p.m. Monday - Friday. Please note that voicemails left after 4:00 p.m. may not be returned until the following business day.  We are closed weekends and major holidays. You have access to a nurse at all times for urgent questions. Please call the main number  to the clinic [phone] and follow the prompts.  For any non-urgent questions, you may also contact your provider using MyChart. We now offer e-Visits for anyone 37 and older to request care online for non-urgent symptoms. For details visit [email or web address].   Also download the MyChart app! Go to the app store, search "MyChart", open the app, select [facility], and log in with your MyChart username and password.

## 2022-11-29 NOTE — Progress Notes (Signed)
Patient presents today for Monoferric iron infusion.  Patient is in satisfactory condition with no new complaints voiced.  Vital signs are stable.  We will proceed with infusion per provider orders.    Patient tolerated treatment well with no complaints voiced.  Patient left ambulatory in stable condition.  Vital signs stable at discharge.  Follow up as scheduled.

## 2023-02-17 ENCOUNTER — Inpatient Hospital Stay: Payer: Medicare Other | Attending: Oncology

## 2023-02-17 DIAGNOSIS — D509 Iron deficiency anemia, unspecified: Secondary | ICD-10-CM | POA: Diagnosis present

## 2023-02-17 DIAGNOSIS — Z720 Tobacco use: Secondary | ICD-10-CM | POA: Diagnosis not present

## 2023-02-17 LAB — FERRITIN: Ferritin: 50 ng/mL (ref 11–307)

## 2023-02-17 LAB — CBC WITH DIFFERENTIAL/PLATELET
Abs Immature Granulocytes: 0.01 10*3/uL (ref 0.00–0.07)
Basophils Absolute: 0 10*3/uL (ref 0.0–0.1)
Basophils Relative: 0 %
Eosinophils Absolute: 0.1 10*3/uL (ref 0.0–0.5)
Eosinophils Relative: 1 %
HCT: 41.3 % (ref 36.0–46.0)
Hemoglobin: 13.7 g/dL (ref 12.0–15.0)
Immature Granulocytes: 0 %
Lymphocytes Relative: 19 %
Lymphs Abs: 1.2 10*3/uL (ref 0.7–4.0)
MCH: 31.4 pg (ref 26.0–34.0)
MCHC: 33.2 g/dL (ref 30.0–36.0)
MCV: 94.7 fL (ref 80.0–100.0)
Monocytes Absolute: 0.5 10*3/uL (ref 0.1–1.0)
Monocytes Relative: 8 %
Neutro Abs: 4.5 10*3/uL (ref 1.7–7.7)
Neutrophils Relative %: 72 %
Platelets: 150 10*3/uL (ref 150–400)
RBC: 4.36 MIL/uL (ref 3.87–5.11)
RDW: 15.9 % — ABNORMAL HIGH (ref 11.5–15.5)
WBC: 6.3 10*3/uL (ref 4.0–10.5)
nRBC: 0 % (ref 0.0–0.2)

## 2023-02-17 LAB — IRON AND TIBC
Iron: 78 ug/dL (ref 28–170)
Saturation Ratios: 23 % (ref 10.4–31.8)
TIBC: 333 ug/dL (ref 250–450)
UIBC: 255 ug/dL

## 2023-02-24 ENCOUNTER — Inpatient Hospital Stay (HOSPITAL_BASED_OUTPATIENT_CLINIC_OR_DEPARTMENT_OTHER): Payer: Medicare Other | Admitting: Oncology

## 2023-02-24 VITALS — BP 141/79 | HR 78 | Temp 96.7°F | Resp 20 | Wt 141.3 lb

## 2023-02-24 DIAGNOSIS — Z720 Tobacco use: Secondary | ICD-10-CM | POA: Diagnosis not present

## 2023-02-24 DIAGNOSIS — D509 Iron deficiency anemia, unspecified: Secondary | ICD-10-CM

## 2023-02-24 NOTE — Assessment & Plan Note (Signed)
Improvement in hemoglobin from 11.9 to 13.7 and ferritin from 11 to 50. No current oral iron supplementation due to constipation. S/p 1 g Monoferric.  -Continue healthy diet with protein and greens. -Repeat labs in 3 months to monitor anemia. -Last colonoscopy: 2022, recommended to repeat in 5 years I.e 2027. One sessile serrated polyp seen.  -If labs are consistent with iron deficiency, consider additional iron infusion.

## 2023-02-24 NOTE — Progress Notes (Signed)
[facility] at [facility] HEMATOLOGY FOLLOW-UP VISIT  [name], MD  REASON FOR FOLLOW-UP: Iron deficiency anemia  ASSESSMENT & PLAN:  Patient is a 67 year old female following up for iron deficiency anemia  Iron deficiency anemia Improvement in hemoglobin from 11.9 to 13.7 and ferritin from 11 to 50. No current oral iron supplementation due to constipation. S/p 1 g Monoferric.  -Continue healthy diet with protein and greens. -Repeat labs in 3 months to monitor anemia. -Last colonoscopy: 2022, recommended to repeat in 5 years I.e 2027. One sessile serrated polyp seen.  -If labs are consistent with iron deficiency, consider additional iron infusion.  Tobacco use -Patient reports a current tobacco use: smoking 1/2 pack of cigarettes per day -35 pack year history -Reports night mares with nicotine patch -Reluctant to retry nicotine patch or Chantix -Discussed risks of smoking elaborately including cancer and cardiac risk and recommended strongly to quit smoking  Follow-up in 3 months with labs   Orders Placed This Encounter  Procedures   Ferritin    Standing Status:   Future    Standing Expiration Date:   02/24/2024   Folate    Standing Status:   Future    Standing Expiration Date:   02/24/2024   Vitamin B12    Standing Status:   Future    Standing Expiration Date:   02/24/2024   CBC with Differential/Platelet    Standing Status:   Future    Standing Expiration Date:   02/24/2024   Comprehensive metabolic panel    Standing Status:   Future    Standing Expiration Date:   02/24/2024   Iron and TIBC    Standing Status:   Future    Standing Expiration Date:   02/24/2024    The total time spent in the appointment was 20 minutes encounter with patients including review of chart and various tests results, discussions about plan of care and coordination of care plan   All questions were answered. The patient knows to call the clinic with any  problems, questions or concerns. No barriers to learning was detected.  Cindie Crumbly, MD 11/18/20242:13 PM    INTERVAL HISTORY: [name] 67 y.o. female is here for follow-up for iron deficiency anemia.She reports feeling constantly tired, attributing this to her busy lifestyle and lack of sleep. She wakes up at 3:30 AM daily and has a busy schedule throughout the day, often feeling too tired to cook dinner by the evening.  She has no other complaints and has been doing good taking care of 3 grandkids.  I have reviewed the past medical history, past surgical history, social history and family history with the patient   ALLERGIES:  is allergic to morphine and statins.  MEDICATIONS:  Current Outpatient Medications  Medication Sig Dispense Refill   albuterol (PROVENTIL HFA;VENTOLIN HFA) 108 (90 BASE) MCG/ACT inhaler Inhale 2 puffs into the lungs every 6 (six) hours as needed for wheezing or shortness of breath.     fexofenadine (ALLEGRA) 180 MG tablet Take 1 tablet by mouth daily.     gabapentin (NEURONTIN) 300 MG capsule Take 900 mg by mouth at bedtime.     hydrochlorothiazide (MICROZIDE) 12.5 MG capsule Take 12.5 mg by mouth daily.     LORazepam (ATIVAN) 0.5 MG tablet Take 0.5 mg by mouth 2 (two) times daily.     Multiple Vitamins-Minerals (ADULT GUMMY PO) Take 2 capsules by mouth daily.     omeprazole (PRILOSEC) 40 MG capsule Take  1 capsule (40 mg total) by mouth 2 (two) times daily. 180 capsule 0   traZODone (DESYREL) 100 MG tablet Take 100 mg by mouth at bedtime.  0   vitamin B-12 (CYANOCOBALAMIN) 500 MCG tablet Take 1,000 mcg by mouth daily.     meloxicam (MOBIC) 15 MG tablet Take 7.5 mg by mouth daily. (Patient not taking: Reported on 02/24/2023)     No current facility-administered medications for this visit.     REVIEW OF SYSTEMS:   Constitutional: Denies fevers, chills or night sweats Eyes: Denies blurriness of vision Ears, nose, mouth, throat, and face: Denies  mucositis or sore throat Respiratory: Denies cough, dyspnea or wheezes Cardiovascular: Denies palpitation, chest discomfort or lower extremity swelling Gastrointestinal:  Denies nausea, heartburn or change in bowel habits Skin: Denies abnormal skin rashes Lymphatics: Denies new lymphadenopathy or easy bruising Neurological:Denies numbness, tingling or new weaknesses Behavioral/Psych: Mood is stable, no new changes  All other systems were reviewed with the patient and are negative.  PHYSICAL EXAMINATION:   Vitals:   02/24/23 0808  BP: (!) 141/79  Pulse: 78  Resp: 20  Temp: (!) 96.7 F (35.9 C)  SpO2: 97%    GENERAL:alert, no distress and comfortable SKIN: skin color, texture, turgor are normal, no rashes or significant lesions LUNGS: clear to auscultation and percussion with normal breathing effort HEART: regular rate & rhythm and no murmurs and no lower extremity edema ABDOMEN:abdomen soft, non-tender and normal bowel sounds Musculoskeletal:no cyanosis of digits and no clubbing  NEURO: alert & oriented x 3 with fluent speech  LABORATORY DATA:  I have reviewed the data as listed  Lab Results  Component Value Date   WBC 6.3 02/17/2023   NEUTROABS 4.5 02/17/2023   HGB 13.7 02/17/2023   HCT 41.3 02/17/2023   MCV 94.7 02/17/2023   PLT 150 02/17/2023      Component Value Date/Time   NA 136 01/15/2021 1043   K 3.9 01/15/2021 1043   CL 100 01/15/2021 1043   CO2 28 01/15/2021 1043   GLUCOSE 95 01/15/2021 1043   BUN 19 01/15/2021 1043   CREATININE 0.57 01/15/2021 1043   CALCIUM 8.6 (L) 01/15/2021 1043   PROT 7.0 07/14/2013 1555   ALBUMIN 4.1 07/14/2013 1555   AST 19 07/14/2013 1555   ALT 14 07/14/2013 1555   ALKPHOS 62 07/14/2013 1555   BILITOT 0.3 07/14/2013 1555   GFRNONAA >60 01/15/2021 1043   GFRAA >60 10/02/2016 1325       Chemistry      Component Value Date/Time   NA 136 01/15/2021 1043   K 3.9 01/15/2021 1043   CL 100 01/15/2021 1043   CO2 28  01/15/2021 1043   BUN 19 01/15/2021 1043   CREATININE 0.57 01/15/2021 1043      Component Value Date/Time   CALCIUM 8.6 (L) 01/15/2021 1043   ALKPHOS 62 07/14/2013 1555   AST 19 07/14/2013 1555   ALT 14 07/14/2013 1555   BILITOT 0.3 07/14/2013 1555     Lab Results  Component Value Date   IRON 78 02/17/2023   TIBC 333 02/17/2023   FERRITIN 50 02/17/2023

## 2023-02-24 NOTE — Patient Instructions (Signed)
VISIT SUMMARY:  You came in today for a follow-up on your anemia. You mentioned feeling constantly tired, which you attribute to your busy lifestyle and lack of sleep. Despite an improvement in your hemoglobin levels, you do not feel any better. You also mentioned your plans for knee surgery in the spring.  YOUR PLAN:  -IRON DEFICIENCY ANEMIA: Iron deficiency anemia is a condition where your body lacks enough iron to produce healthy red blood cells, leading to fatigue and weakness. Your hemoglobin and ferritin levels have improved, but you still feel fatigued, likely due to poor sleep. Continue your healthy diet with protein and greens. We will repeat your labs in 3 months to monitor your anemia. If your levels are low again, we may consider additional iron infusion.  -GENERAL HEALTH MAINTENANCE: Your next colonoscopy is due in 2027. You also have plans for knee surgery in the spring. Continue to follow a healthy lifestyle.  INSTRUCTIONS:  Please follow up in 3 months for repeat labs to monitor your anemia.

## 2023-02-24 NOTE — Assessment & Plan Note (Addendum)
-  Patient reports a current tobacco use: smoking 1/2 pack of cigarettes per day -35 pack year history -Reports night mares with nicotine patch -Reluctant to retry nicotine patch or Chantix -Discussed risks of smoking elaborately including cancer and cardiac risk and recommended strongly to quit smoking  Follow-up in 3 months with labs

## 2023-05-15 ENCOUNTER — Inpatient Hospital Stay: Payer: Medicare Other | Attending: Oncology

## 2023-05-15 DIAGNOSIS — F17210 Nicotine dependence, cigarettes, uncomplicated: Secondary | ICD-10-CM | POA: Diagnosis not present

## 2023-05-15 DIAGNOSIS — D509 Iron deficiency anemia, unspecified: Secondary | ICD-10-CM | POA: Diagnosis present

## 2023-05-15 LAB — FERRITIN: Ferritin: 43 ng/mL (ref 11–307)

## 2023-05-15 LAB — CBC WITH DIFFERENTIAL/PLATELET
Abs Immature Granulocytes: 0.01 10*3/uL (ref 0.00–0.07)
Basophils Absolute: 0 10*3/uL (ref 0.0–0.1)
Basophils Relative: 0 %
Eosinophils Absolute: 0.1 10*3/uL (ref 0.0–0.5)
Eosinophils Relative: 2 %
HCT: 37.8 % (ref 36.0–46.0)
Hemoglobin: 13.2 g/dL (ref 12.0–15.0)
Immature Granulocytes: 0 %
Lymphocytes Relative: 25 %
Lymphs Abs: 1.3 10*3/uL (ref 0.7–4.0)
MCH: 34.4 pg — ABNORMAL HIGH (ref 26.0–34.0)
MCHC: 34.9 g/dL (ref 30.0–36.0)
MCV: 98.4 fL (ref 80.0–100.0)
Monocytes Absolute: 0.6 10*3/uL (ref 0.1–1.0)
Monocytes Relative: 11 %
Neutro Abs: 3.3 10*3/uL (ref 1.7–7.7)
Neutrophils Relative %: 62 %
Platelets: 159 10*3/uL (ref 150–400)
RBC: 3.84 MIL/uL — ABNORMAL LOW (ref 3.87–5.11)
RDW: 13.2 % (ref 11.5–15.5)
WBC: 5.3 10*3/uL (ref 4.0–10.5)
nRBC: 0 % (ref 0.0–0.2)

## 2023-05-15 LAB — IRON AND TIBC
Iron: 76 ug/dL (ref 28–170)
Saturation Ratios: 23 % (ref 10.4–31.8)
TIBC: 333 ug/dL (ref 250–450)
UIBC: 257 ug/dL

## 2023-05-15 LAB — COMPREHENSIVE METABOLIC PANEL
ALT: 25 U/L (ref 0–44)
AST: 23 U/L (ref 15–41)
Albumin: 4.1 g/dL (ref 3.5–5.0)
Alkaline Phosphatase: 52 U/L (ref 38–126)
Anion gap: 9 (ref 5–15)
BUN: 15 mg/dL (ref 8–23)
CO2: 26 mmol/L (ref 22–32)
Calcium: 8.6 mg/dL — ABNORMAL LOW (ref 8.9–10.3)
Chloride: 100 mmol/L (ref 98–111)
Creatinine, Ser: 0.8 mg/dL (ref 0.44–1.00)
GFR, Estimated: >60 mL/min (ref >60)
Glucose, Bld: 107 mg/dL — ABNORMAL HIGH (ref 70–99)
Potassium: 4 mmol/L (ref 3.5–5.1)
Sodium: 135 mmol/L (ref 135–145)
Total Bilirubin: 0.6 mg/dL (ref 0.0–1.2)
Total Protein: 6.6 g/dL (ref 6.5–8.1)

## 2023-05-15 LAB — FOLATE: Folate: 15.6 ng/mL (ref >5.9)

## 2023-05-15 LAB — VITAMIN B12: Vitamin B-12: 1930 pg/mL — ABNORMAL HIGH (ref 180–914)

## 2023-05-16 ENCOUNTER — Inpatient Hospital Stay: Payer: Medicare Other

## 2023-05-23 ENCOUNTER — Inpatient Hospital Stay: Payer: Medicare Other | Admitting: Oncology

## 2023-05-23 VITALS — BP 134/79 | HR 74 | Temp 98.0°F | Resp 18 | Ht 66.0 in | Wt 146.4 lb

## 2023-05-23 DIAGNOSIS — Z720 Tobacco use: Secondary | ICD-10-CM

## 2023-05-23 DIAGNOSIS — D509 Iron deficiency anemia, unspecified: Secondary | ICD-10-CM

## 2023-05-23 NOTE — Progress Notes (Signed)
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  S/p 1 g Monoferric with improvement in iron levels and resolution of anemia.  Cannot tolerate oral iron due to constipation. -Continue healthy diet with protein and greens. -Last colonoscopy: 2022, recommended to repeat in 5 years i.e 2027. One sessile serrated polyp seen.   -Return to clinic in 6 months with labs.  Tobacco use -Patient reports a current tobacco use: smoking 1/2 pack of cigarettes per day. 35 pack year history -Reports night mares with nicotine patch. Reluctant to retry nicotine patch or Chantix -Discussed risks of smoking elaborately including cancer and cardiac risk and recommended strongly to quit smoking    Orders Placed This Encounter  Procedures   Ferritin    Standing Status:   Future    Expected Date:   11/17/2023    Expiration Date:   05/22/2024   Folate    Standing Status:   Future    Expected Date:   11/17/2023    Expiration Date:   05/22/2024   Vitamin B12    Standing Status:   Future    Expected Date:   11/17/2023    Expiration Date:   05/22/2024   Comprehensive metabolic panel    Standing Status:   Future    Expected Date:   11/17/2023    Expiration Date:   05/22/2024   CBC with Differential/Platelet    Standing Status:   Future    Expected Date:   11/17/2023    Expiration Date:   05/22/2024   Iron and TIBC    Standing Status:   Future    Expected Date:   11/17/2023    Expiration Date:   05/22/2024    The total time spent in the appointment was 20 minutes encounter with patients including review of chart and various tests results, discussions about plan of care and coordination of care plan   All questions were answered. The patient  knows to call the clinic with any problems, questions or concerns. No barriers to learning was detected.  Cindie Crumbly, MD 2/14/20251:40 PM    INTERVAL HISTORY: [name] 68 y.o. female is here for follow-up for iron deficiency anemia.  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 Despite this, she reports feeling better overall and is no longer anemic.  She is also scheduled for knee surgery on April 28th, which she expresses significant anxiety about. She is concerned about the recovery period and the potential impact on her mobility.  The patient also discusses her smoking habit, admitting to smoking about half a pack a day. She is aware of the health risks and expresses a desire to quit, particularly when her grandchild is present.  I have reviewed the past medical history, past surgical history, social history and family history with the patient   ALLERGIES:  is allergic to morphine and statins.  MEDICATIONS:  Current Outpatient Medications  Medication Sig Dispense Refill   albuterol (PROVENTIL HFA;VENTOLIN HFA) 108 (90 BASE) MCG/ACT inhaler Inhale 2 puffs into the lungs every 6 (six) hours as needed for wheezing or shortness of breath.     fexofenadine (ALLEGRA) 180 MG tablet Take 1 tablet by  mouth daily.     gabapentin (NEURONTIN) 300 MG capsule Take 900 mg by mouth at bedtime.     hydrochlorothiazide (MICROZIDE) 12.5 MG capsule Take 12.5 mg by mouth daily.     LORazepam (ATIVAN) 1 MG tablet Take 1 mg by mouth at bedtime.     meloxicam (MOBIC) 15 MG tablet Take 7.5 mg by mouth daily.     Multiple Vitamins-Minerals (ADULT GUMMY PO) Take 2 capsules by mouth daily.     omeprazole (PRILOSEC) 40 MG capsule Take 1 capsule (40 mg total) by mouth 2 (two) times daily. 180 capsule 0   traZODone (DESYREL) 100 MG tablet Take 100 mg by mouth at bedtime.  0   vitamin  B-12 (CYANOCOBALAMIN) 500 MCG tablet Take 1,000 mcg by mouth daily.     No current facility-administered medications for this visit.     REVIEW OF SYSTEMS:   Constitutional: Denies fevers, chills or night sweats Eyes: Denies blurriness of vision Ears, nose, mouth, throat, and face: Denies mucositis or sore throat Respiratory: Denies cough, dyspnea or wheezes Cardiovascular: Denies palpitation, chest discomfort or lower extremity swelling Gastrointestinal:  Denies nausea, heartburn or change in bowel habits Skin: Denies abnormal skin rashes Lymphatics: Denies new lymphadenopathy or easy bruising Neurological:Denies numbness, tingling or new weaknesses Behavioral/Psych: Mood is stable, no new changes  All other systems were reviewed with the patient and are negative.  PHYSICAL EXAMINATION:   Vitals:   05/23/23 0811  BP: 134/79  Pulse: 74  Resp: 18  Temp: 98 F (36.7 C)  SpO2: 98%    GENERAL:alert, no distress and comfortable SKIN: skin color, texture, turgor are normal, no rashes or significant lesions LUNGS: clear to auscultation and percussion with normal breathing effort HEART: regular rate & rhythm and no murmurs and no lower extremity edema ABDOMEN:abdomen soft, non-tender and normal bowel sounds Musculoskeletal:no cyanosis of digits and no clubbing  NEURO: alert & oriented x 3 with fluent speech  LABORATORY DATA:  I have reviewed the data as listed  Lab Results  Component Value Date   WBC 5.3 05/15/2023   NEUTROABS 3.3 05/15/2023   HGB 13.2 05/15/2023   HCT 37.8 05/15/2023   MCV 98.4 05/15/2023   PLT 159 05/15/2023       Chemistry      Component Value Date/Time   NA 135 05/15/2023 1449   K 4.0 05/15/2023 1449   CL 100 05/15/2023 1449   CO2 26 05/15/2023 1449   BUN 15 05/15/2023 1449   CREATININE 0.80 05/15/2023 1449      Component Value Date/Time   CALCIUM 8.6 (L) 05/15/2023 1449   ALKPHOS 52 05/15/2023 1449   AST 23 05/15/2023 1449   ALT 25  05/15/2023 1449   BILITOT 0.6 05/15/2023 1449      Latest Reference Range & Units 02/17/23 07:55 05/15/23 14:49 05/15/23 14:50  Iron 28 - 170 ug/dL 78 76   UIBC ug/dL 981 191   TIBC 478 - 295 ug/dL 621 308   Saturation Ratios 10.4 - 31.8 % 23 23   Ferritin 11 - 307 ng/mL 50 43   Folate >5.9 ng/mL   15.6  Vitamin B12 180 - 914 pg/mL  1,930 (H)   (H): Data is abnormally high

## 2023-05-23 NOTE — Assessment & Plan Note (Signed)
Patient reports a current tobacco use: smoking 1/2 pack of cigarettes per day 35 pack year history Reports night mares with nicotine patch Reluctant to retry nicotine patch or Chantix Discussed risks of smoking elaborately including cancer and cardiac risk and recommended strongly to quit smoking

## 2023-05-23 NOTE — Assessment & Plan Note (Addendum)
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  S/p 1 g Monoferric with improvement in iron levels and resolution of anemia.  Cannot tolerate oral iron due to constipation. -Continue healthy diet with protein and greens. -Last colonoscopy: 2022, recommended to repeat in 5 years i.e 2027. One sessile serrated polyp seen.   -Return to clinic in 6 months with labs.

## 2023-08-19 NOTE — H&P (Signed)
 TOTAL KNEE ADMISSION H&P  Patient is being admitted for right total knee arthroplasty.  Subjective:  Chief Complaint: Right knee pain.  HPI: [name]  [name], 68 y.o. female has a history of pain and functional disability in the right knee due to arthritis and has failed non-surgical conservative treatments for greater than 12 weeks to include NSAID's and/or analgesics, corticosteriod injections, viscosupplementation injections, and activity modification. Onset of symptoms was gradual, starting several years ago with gradually worsening course since that time. The patient noted no past surgery on the right knee.  Patient currently rates pain in the right knee at 8 out of 10 with activity. Patient has night pain, worsening of pain with activity and weight bearing, and pain that interferes with activities of daily living. Patient has evidence of severe bone-on-bone arthritis in the right knee, medial and patellofemoral with tibial subluxation and varus deformity by imaging studies. There is no active infection.  Patient Active Problem List   Diagnosis Date Noted   Iron deficiency anemia 11/18/2022   Tobacco use 11/18/2022   Unilateral primary osteoarthritis, right knee 02/17/2020   Complete tear of right rotator cuff 02/17/2020   Alcohol  abuse 07/19/2013   Alcohol  dependence with intoxication (HCC) 07/15/2013   Severe episode of recurrent major depressive disorder (HCC) 07/15/2013   CHEST PAIN, ATYPICAL 11/30/2009   ASTHMATIC BRONCHITIS, ACUTE 05/05/2007   Allergic rhinitis 05/05/2007   Asthma 05/05/2007   Esophageal reflux 05/05/2007   INSOMNIA 05/05/2007    Past Medical History:  Diagnosis Date   Alcoholism (HCC)    Anxiety    Arthritis    Asthma    Depression    GERD (gastroesophageal reflux disease)    Hypertension     Past Surgical History:  Procedure Laterality Date   CESAREAN SECTION     x 2   COLONOSCOPY WITH PROPOFOL  N/A 01/17/2021   Procedure: COLONOSCOPY WITH  PROPOFOL ;  Surgeon: Urban Garden, MD;  Location: [facility];  Service: Gastroenterology;  Laterality: N/A;  1:50   ESOPHAGOGASTRODUODENOSCOPY (EGD) WITH PROPOFOL  N/A 01/17/2021   Procedure: ESOPHAGOGASTRODUODENOSCOPY (EGD) WITH PROPOFOL ;  Surgeon: Urban Garden, MD;  Location: [facility];  Service: Gastroenterology;  Laterality: N/A;   GASTRIC BYPASS     KNEE ARTHROSCOPY Right    LAPAROSCOPIC HYSTERECTOMY     POLYPECTOMY  01/17/2021   Procedure: POLYPECTOMY;  Surgeon: Urban Garden, MD;  Location: [facility];  Service: Gastroenterology;;    Prior to Admission medications   Medication Sig Start Date End Date Taking? Authorizing Provider  albuterol  (PROVENTIL  HFA;VENTOLIN  HFA) 108 (90 BASE) MCG/ACT inhaler Inhale 2 puffs into the lungs every 6 (six) hours as needed for wheezing or shortness of breath.    [provider]  fexofenadine (ALLEGRA) 180 MG tablet Take 1 tablet by mouth daily.    [provider]  gabapentin (NEURONTIN) 300 MG capsule Take 900 mg by mouth at bedtime. 01/25/20   [provider]  hydrochlorothiazide  (MICROZIDE ) 12.5 MG capsule Take 12.5 mg by mouth daily. 01/25/20   [provider]  LORazepam (ATIVAN) 1 MG tablet Take 1 mg by mouth at bedtime. 04/16/23   [provider]  meloxicam (MOBIC) 15 MG tablet Take 7.5 mg by mouth daily. 04/25/20   [provider]  Multiple Vitamins-Minerals (ADULT GUMMY PO) Take 2 capsules by mouth daily.    [provider]  omeprazole  (PRILOSEC) 40 MG capsule Take 1 capsule (40 mg total) by mouth 2 (two) times daily. 01/17/21  Castaneda Mayorga, Bearl Limes, MD  traZODone  (DESYREL ) 100 MG tablet Take 100 mg by mouth at bedtime. 08/22/16   [provider]  vitamin B-12 (CYANOCOBALAMIN ) 500 MCG tablet Take 1,000 mcg by mouth daily.    [provider]    Allergies  Allergen Reactions   Morphine Nausea And Vomiting   Statins      Affected muscles, couldn't move    Social History   Socioeconomic History   Marital status: Married    Spouse name: Not on file   Number of children: Not on file   Years of education: Not on file   Highest education level: Not on file  Occupational History   Not on file  Tobacco Use   Smoking status: Every Day    Current packs/day: 1.00    Types: Cigarettes   Smokeless tobacco: Never  Vaping Use   Vaping status: Never Used  Substance and Sexual Activity   Alcohol  use: Not Currently    Alcohol /week: 21.0 standard drinks of alcohol     Types: 18 Cans of beer, 3 Standard drinks or equivalent per week    Comment: 24 beers, or 2 fifths (10/02/16-none now)   Drug use: No   Sexual activity: Yes  Other Topics Concern   Not on file  Social History Narrative   Not on file   Social Drivers of Health   Financial Resource Strain: Not on file  Food Insecurity: Not on file  Transportation Needs: Not on file  Physical Activity: Not on file  Stress: Not on file  Social Connections: Not on file  Intimate Partner Violence: Not on file    Tobacco Use: High Risk (11/18/2022)   Patient History    Smoking Tobacco Use: Every Day    Smokeless Tobacco Use: Never    Passive Exposure: Not on file   Social History   Substance and Sexual Activity  Alcohol  Use Not Currently   Alcohol /week: 21.0 standard drinks of alcohol    Types: 18 Cans of beer, 3 Standard drinks or equivalent per week   Comment: 24 beers, or 2 fifths (10/02/16-none now)    No family history on file.  ROS  Objective:  Physical Exam: Well nourished and well developed.  General: Alert and oriented x3, cooperative and pleasant, no acute distress.  Head: normocephalic, atraumatic, neck supple.  Eyes: EOMI.  Abdomen: non-tender to palpation and soft, normoactive bowel sounds. Musculoskeletal: - Right knee: No effusion, significant varus deformity, Baker's cyst, range of motion 0 to approximately 105 degrees with  crepitus, tender medially, no lateral tenderness or instability.  - Left knee: Normal range of motion with no discomfort. Calves soft and nontender. Motor function intact in LE. Strength 5/5 LE bilaterally. Neuro: Distal pulses 2+. Sensation to light touch intact in LE.  Vital signs in last 24 hours: BP: ()/()  Arterial Line BP: ()/()   Imaging Review Plain radiographs demonstrate severe degenerative joint disease of the right knee. The overall alignment is mild varus. The bone quality appears to be adequate for age and reported activity level.  Assessment/Plan:  End stage arthritis, right knee   The patient history, physical examination, clinical judgment of the provider and imaging studies are consistent with end stage degenerative joint disease of the right knee and total knee arthroplasty is deemed medically necessary. The treatment options including medical management, injection therapy arthroscopy and arthroplasty were discussed at length. The risks and benefits of total knee arthroplasty were presented and reviewed. The risks due to aseptic loosening,  infection, stiffness, patella tracking problems, thromboembolic complications and other imponderables were discussed. The patient acknowledged the explanation, agreed to proceed with the plan and consent was signed. Patient is being admitted for inpatient treatment for surgery, pain control, PT, OT, prophylactic antibiotics, VTE prophylaxis, progressive ambulation and ADLs and discharge planning. The patient is planning to be discharged home.  Patient's anticipated LOS is less than 2 midnights, meeting these requirements: - Lives within 1 hour of care - Has a competent adult at home to recover with post-op - NO history of  - Chronic pain requiring opiods  - Diabetes  - Coronary Artery Disease  - Heart failure  - Heart attack  - Stroke  - DVT/VTE  - Cardiac arrhythmia  - Respiratory Failure/COPD  - Renal failure  - Anemia  -  Advanced Liver disease  Therapy Plans: EO [place] Disposition: Home with Daughter Planned DVT Prophylaxis: Aspirin 81 mg BID DME Needed: RW PCP: Sims Duck, MD (clearance received) TXA: IV Allergies: statins (paralysis), morphine (vomiting) Anesthesia Concerns: N/V BMI: 24.5 Last HgbA1c: not diabetic  [facility]: [facility] (deliver to room)  Other: -Has tolerated oxycodone  - Patient was instructed on what medications to stop prior to surgery. - Follow-up visit in 2 weeks with Dr. France Ina - Begin physical therapy following surgery - Pre-operative lab work as pre-surgical testing - Prescriptions will be provided in hospital at time of discharge  R. Brinton Canavan, PA-C Orthopedic Surgery [facility] [place]

## 2023-08-25 NOTE — Patient Instructions (Addendum)
 SURGICAL WAITING ROOM VISITATION Patients having surgery or a procedure may have no more than 2 support people in the waiting area - these visitors may rotate.    Children under the age of 70 must have an adult with them who is not the patient.  If the patient needs to stay at the hospital during part of their recovery, the visitor guidelines for inpatient rooms apply. Pre-op nurse will coordinate an appropriate time for 1 support person to accompany patient in pre-op.  This support person may not rotate.    Please refer to the [facility] website for the visitor guidelines for Inpatients (after your surgery is over and you are in a regular room).       Your procedure is scheduled on: 09-15-23   Report to [facility] Main Entrance    Report to admitting at 5:50 AM   Call this number if you have problems the morning of surgery [phone]   Do not eat food :After Midnight.   After Midnight you may have the following liquids until 5:20 AM DAY OF SURGERY  Water  Non-Citrus Juices (without pulp, NO RED-Apple, White grape, White cranberry) Black Coffee (NO MILK/CREAM OR CREAMERS, sugar ok)  Clear Tea (NO MILK/CREAM OR CREAMERS, sugar ok) regular and decaf                             Plain Jell-O (NO RED)                                           Fruit ices (not with fruit pulp, NO RED)                                     Popsicles (NO RED)                                                               Sports drinks like Gatorade (NO RED)                   The day of surgery:  Drink ONE (1) Pre-Surgery Clear Ensure by 5:20  AM the morning of surgery. Drink in one sitting. Do not sip.  This drink was given to you during your hospital  pre-op appointment visit. Nothing else to drink after completing the Pre-Surgery Clear Ensure .          If you have questions, please contact your surgeon's office.   FOLLOW ANY ADDITIONAL PRE OP INSTRUCTIONS YOU RECEIVED FROM YOUR SURGEON'S  OFFICE!!!     Oral Hygiene is also important to reduce your risk of infection.                                    Remember - BRUSH YOUR TEETH THE MORNING OF SURGERY WITH YOUR REGULAR TOOTHPASTE   Do NOT smoke after Midnight   Take these medicines the morning of surgery with A SIP OF WATER :    Allegra  Omeprazole    Tylenol  if needed  Stop all vitamins and herbal supplements 7 days before surgery                              You may not have any metal on your body including hair pins, jewelry, and body piercing             Do not wear make-up, lotions, powders, perfumes, or deodorant  Do not wear nail polish including gel and S&S, artificial/acrylic nails, or any other type of covering on natural nails including finger and toenails. If you have artificial nails, gel coating, etc. that needs to be removed by a nail salon please have this removed prior to surgery or surgery may need to be canceled/ delayed if the surgeon/ anesthesia feels like they are unable to be safely monitored.   Do not shave  48 hours prior to surgery.         Do not bring valuables to the hospital. [facility] IS NOT RESPONSIBLE   FOR VALUABLES.   Contacts, dentures or bridgework may not be worn into surgery.   Bring small overnight bag day of surgery.   DO NOT BRING YOUR HOME MEDICATIONS TO THE HOSPITAL. PHARMACY WILL DISPENSE MEDICATIONS LISTED ON YOUR MEDICATION LIST TO YOU DURING YOUR ADMISSION IN THE HOSPITAL!              Please read over the following fact sheets you were given: IF YOU HAVE QUESTIONS ABOUT YOUR PRE-OP INSTRUCTIONS PLEASE CALL [phone] Gwen  If you received a COVID test during your pre-op visit  it is requested that you wear a mask when out in public, stay away from anyone that may not be feeling well and notify your surgeon if you develop symptoms. If you test positive for Covid or have been in contact with anyone that has tested positive in the last 10 days please notify you  surgeon.    Pre-operative 5 CHG Bath Instructions   You can play a key role in reducing the risk of infection after surgery. Your skin needs to be as free of germs as possible. You can reduce the number of germs on your skin by washing with CHG (chlorhexidine gluconate) soap before surgery. CHG is an antiseptic soap that kills germs and continues to kill germs even after washing.   DO NOT use if you have an allergy to chlorhexidine/CHG or antibacterial soaps. If your skin becomes reddened or irritated, stop using the CHG and notify one of our RNs at [phone].   Please shower with the CHG soap starting 4 days before surgery using the following schedule:     Please keep in mind the following:  DO NOT shave, including legs and underarms, starting the day of your first shower.   You may shave your face at any point before/day of surgery.  Place clean sheets on your bed the day you start using CHG soap. Use a clean washcloth (not used since being washed) for each shower. DO NOT sleep with pets once you start using the CHG.   CHG Shower Instructions:  If you choose to wash your hair and private area, wash first with your normal shampoo/soap.  After you use shampoo/soap, rinse your hair and body thoroughly to remove shampoo/soap residue.  Turn the water  OFF and apply about 3 tablespoons (45 ml) of CHG soap to a CLEAN washcloth.  Apply CHG soap ONLY FROM YOUR  NECK DOWN TO YOUR TOES (washing for 3-5 minutes)  DO NOT use CHG soap on face, private areas, open wounds, or sores.  Pay special attention to the area where your surgery is being performed.  If you are having back surgery, having someone wash your back for you may be helpful. Wait 2 minutes after CHG soap is applied, then you may rinse off the CHG soap.  Pat dry with a clean towel  Put on clean clothes/pajamas   If you choose to wear lotion, please use ONLY the CHG-compatible lotions on the back of this paper.     Additional  instructions for the day of surgery: DO NOT APPLY any lotions, deodorants, cologne, or perfumes.   Put on clean/comfortable clothes.  Brush your teeth.  Ask your nurse before applying any prescription medications to the skin.      CHG Compatible Lotions   Aveeno Moisturizing lotion  Cetaphil Moisturizing Cream  Cetaphil Moisturizing Lotion  Clairol Herbal Essence Moisturizing Lotion, Dry Skin  Clairol Herbal Essence Moisturizing Lotion, Extra Dry Skin  Clairol Herbal Essence Moisturizing Lotion, Normal Skin  Curel Age Defying Therapeutic Moisturizing Lotion with Alpha Hydroxy  Curel Extreme Care Body Lotion  Curel Soothing Hands Moisturizing Hand Lotion  Curel Therapeutic Moisturizing Cream, Fragrance-Free  Curel Therapeutic Moisturizing Lotion, Fragrance-Free  Curel Therapeutic Moisturizing Lotion, Original Formula  Eucerin Daily Replenishing Lotion  Eucerin Dry Skin Therapy Plus Alpha Hydroxy Crme  Eucerin Dry Skin Therapy Plus Alpha Hydroxy Lotion  Eucerin Original Crme  Eucerin Original Lotion  Eucerin Plus Crme Eucerin Plus Lotion  Eucerin TriLipid Replenishing Lotion  Keri Anti-Bacterial Hand Lotion  Keri Deep Conditioning Original Lotion Dry Skin Formula Softly Scented  Keri Deep Conditioning Original Lotion, Fragrance Free Sensitive Skin Formula  Keri Lotion Fast Absorbing Fragrance Free Sensitive Skin Formula  Keri Lotion Fast Absorbing Softly Scented Dry Skin Formula  Keri Original Lotion  Keri Skin Renewal Lotion Keri Silky Smooth Lotion  Keri Silky Smooth Sensitive Skin Lotion  Nivea Body Creamy Conditioning Oil  Nivea Body Extra Enriched Lotion  Nivea Body Original Lotion  Nivea Body Sheer Moisturizing Lotion Nivea Crme  Nivea Skin Firming Lotion  NutraDerm 30 Skin Lotion  NutraDerm Skin Lotion  NutraDerm Therapeutic Skin Cream  NutraDerm Therapeutic Skin Lotion  ProShield Protective Hand Cream  Provon moisturizing lotion   PATIENT  SIGNATURE_________________________________  NURSE SIGNATURE__________________________________  ________________________________________________________________________    [name]  An incentive spirometer is a tool that can help keep your lungs clear and active. This tool measures how well you are filling your lungs with each breath. Taking long deep breaths may help reverse or decrease the chance of developing breathing (pulmonary) problems (especially infection) following: A long period of time when you are unable to move or be active. BEFORE THE PROCEDURE  If the spirometer includes an indicator to show your best effort, your nurse or respiratory therapist will set it to a desired goal. If possible, sit up straight or lean slightly forward. Try not to slouch. Hold the incentive spirometer in an upright position. INSTRUCTIONS FOR USE  Sit on the edge of your bed if possible, or sit up as far as you can in bed or on a chair. Hold the incentive spirometer in an upright position. Breathe out normally. Place the mouthpiece in your mouth and seal your lips tightly around it. Breathe in slowly and as deeply as possible, raising the piston or the ball toward the top of the column. Hold your breath for  3-5 seconds or for as long as possible. Allow the piston or ball to fall to the bottom of the column. Remove the mouthpiece from your mouth and breathe out normally. Rest for a few seconds and repeat Steps 1 through 7 at least 10 times every 1-2 hours when you are awake. Take your time and take a few normal breaths between deep breaths. The spirometer may include an indicator to show your best effort. Use the indicator as a goal to work toward during each repetition. After each set of 10 deep breaths, practice coughing to be sure your lungs are clear. If you have an incision (the cut made at the time of surgery), support your incision when coughing by placing a pillow or rolled up towels  firmly against it. Once you are able to get out of bed, walk around indoors and cough well. You may stop using the incentive spirometer when instructed by your caregiver.  RISKS AND COMPLICATIONS Take your time so you do not get dizzy or light-headed. If you are in pain, you may need to take or ask for pain medication before doing incentive spirometry. It is harder to take a deep breath if you are having pain. AFTER USE Rest and breathe slowly and easily. It can be helpful to keep track of a log of your progress. Your caregiver can provide you with a simple table to help with this. If you are using the spirometer at home, follow these instructions: SEEK MEDICAL CARE IF:  You are having difficultly using the spirometer. You have trouble using the spirometer as often as instructed. Your pain medication is not giving enough relief while using the spirometer. You develop fever of 100.5 F (38.1 C) or higher. SEEK IMMEDIATE MEDICAL CARE IF:  You cough up bloody sputum that had not been present before. You develop fever of 102 F (38.9 C) or greater. You develop worsening pain at or near the incision site. MAKE SURE YOU:  Understand these instructions. Will watch your condition. Will get help right away if you are not doing well or get worse. Document Released: 08/05/2006 Document Revised: 06/17/2011 Document Reviewed: 10/06/2006 ExitCare Patient Information 2014 [employer].   ________________________________________________________________________View Pre-Surgery Education Videos:  [email or web address]

## 2023-08-26 ENCOUNTER — Telehealth: Payer: Self-pay | Admitting: *Deleted

## 2023-08-26 ENCOUNTER — Other Ambulatory Visit: Payer: Self-pay | Admitting: *Deleted

## 2023-08-26 DIAGNOSIS — D509 Iron deficiency anemia, unspecified: Secondary | ICD-10-CM

## 2023-08-26 NOTE — Telephone Encounter (Signed)
 Patient called with concerns that her iron is low, as she is extremely fatigued.  Placed on schedule for labs tomorrow and will consult with Dr. Cheree Cords with results and recommendations.

## 2023-08-27 ENCOUNTER — Other Ambulatory Visit

## 2023-08-27 ENCOUNTER — Inpatient Hospital Stay: Attending: Hematology

## 2023-08-27 DIAGNOSIS — D509 Iron deficiency anemia, unspecified: Secondary | ICD-10-CM | POA: Insufficient documentation

## 2023-08-27 LAB — CBC
HCT: 38.6 % (ref 36.0–46.0)
Hemoglobin: 12.6 g/dL (ref 12.0–15.0)
MCH: 32.9 pg (ref 26.0–34.0)
MCHC: 32.6 g/dL (ref 30.0–36.0)
MCV: 100.8 fL — ABNORMAL HIGH (ref 80.0–100.0)
Platelets: 147 10*3/uL — ABNORMAL LOW (ref 150–400)
RBC: 3.83 MIL/uL — ABNORMAL LOW (ref 3.87–5.11)
RDW: 13.2 % (ref 11.5–15.5)
WBC: 8.6 10*3/uL (ref 4.0–10.5)
nRBC: 0 % (ref 0.0–0.2)

## 2023-08-27 LAB — FERRITIN: Ferritin: 54 ng/mL (ref 11–307)

## 2023-08-27 LAB — IRON AND TIBC
Iron: 74 ug/dL (ref 28–170)
Saturation Ratios: 24 % (ref 10.4–31.8)
TIBC: 313 ug/dL (ref 250–450)
UIBC: 239 ug/dL

## 2023-08-29 ENCOUNTER — Encounter (HOSPITAL_COMMUNITY): Payer: Self-pay

## 2023-08-29 NOTE — Progress Notes (Addendum)
 COVID Vaccine Completed:  Date of COVID positive in last 90 days:  No  PCP - Sims Duck, MD Cardiologist - Riccardo Chamberlain, MD (last OV 2022)  Medical clearance under media  Chest x-ray - N/A EKG - 09-08-23 Epic Stress Test - 10-14-08 Epic ECHO - 10-03-20 Epic Cardiac Cath - N/A Pacemaker/ICD device last checked:  N/A Spinal Cord Stimulator: N/A  Bowel Prep - N/A  Sleep Study - N/A CPAP -   Fasting Blood Sugar - N/A Checks Blood Sugar _____ times a day  Last dose of GLP1 agonist-  N/A GLP1 instructions:  Hold 7 days before surgery    Last dose of SGLT-2 inhibitors-  N/A SGLT-2 instructions:  Hold 3 days before surgery    Blood Thinner Instructions:  N/A Aspirin Instructions: Last Dose:  Activity level:  Can go up a flight of stairs and perform activities of daily living without stopping and without symptoms of chest pain or shortness of breath.  Some limitations due to knee pain.  Anesthesia review:  Mild aortic stenosis on echo 2022. Mumur, HTN  Patient denies shortness of breath, fever, cough and chest pain at PAT appointment  Patient verbalized understanding of instructions that were given to them at the PAT appointment. Patient was also instructed that they will need to review over the PAT instructions again at home before surgery.

## 2023-09-04 ENCOUNTER — Ambulatory Visit: Payer: Self-pay | Admitting: *Deleted

## 2023-09-04 NOTE — Progress Notes (Signed)
 Message left with normal results and no follow up needed at this time.

## 2023-09-08 ENCOUNTER — Encounter (HOSPITAL_COMMUNITY): Payer: Self-pay

## 2023-09-08 ENCOUNTER — Other Ambulatory Visit: Payer: Self-pay

## 2023-09-08 ENCOUNTER — Encounter (HOSPITAL_COMMUNITY)
Admission: RE | Admit: 2023-09-08 | Discharge: 2023-09-08 | Disposition: A | Discharge status: Home / Self Care | Source: Ambulatory Visit | Attending: Orthopedic Surgery | Admitting: Orthopedic Surgery

## 2023-09-08 VITALS — BP 142/85 | HR 70 | Temp 98.1°F | Resp 16 | Ht 65.0 in | Wt 146.0 lb

## 2023-09-08 DIAGNOSIS — Z87891 Personal history of nicotine dependence: Secondary | ICD-10-CM | POA: Insufficient documentation

## 2023-09-08 DIAGNOSIS — I350 Nonrheumatic aortic (valve) stenosis: Secondary | ICD-10-CM | POA: Insufficient documentation

## 2023-09-08 DIAGNOSIS — I119 Hypertensive heart disease without heart failure: Secondary | ICD-10-CM | POA: Diagnosis not present

## 2023-09-08 DIAGNOSIS — M1711 Unilateral primary osteoarthritis, right knee: Secondary | ICD-10-CM | POA: Insufficient documentation

## 2023-09-08 DIAGNOSIS — I10 Essential (primary) hypertension: Secondary | ICD-10-CM

## 2023-09-08 DIAGNOSIS — Z01818 Encounter for other preprocedural examination: Secondary | ICD-10-CM | POA: Insufficient documentation

## 2023-09-08 DIAGNOSIS — R011 Cardiac murmur, unspecified: Secondary | ICD-10-CM | POA: Insufficient documentation

## 2023-09-08 HISTORY — DX: Cardiac murmur, unspecified: R01.1

## 2023-09-08 HISTORY — DX: Headache, unspecified: R51.9

## 2023-09-08 HISTORY — DX: Anemia, unspecified: D64.9

## 2023-09-08 HISTORY — DX: Nonrheumatic aortic (valve) stenosis: I35.0

## 2023-09-08 LAB — BASIC METABOLIC PANEL WITH GFR
Anion gap: 6 (ref 5–15)
BUN: 16 mg/dL (ref 8–23)
CO2: 26 mmol/L (ref 22–32)
Calcium: 9 mg/dL (ref 8.9–10.3)
Chloride: 105 mmol/L (ref 98–111)
Creatinine, Ser: 0.46 mg/dL (ref 0.44–1.00)
GFR, Estimated: >60 mL/min (ref >60)
Glucose, Bld: 87 mg/dL (ref 70–99)
Potassium: 4.1 mmol/L (ref 3.5–5.1)
Sodium: 137 mmol/L (ref 135–145)

## 2023-09-08 LAB — SURGICAL PCR SCREEN
MRSA, PCR: POSITIVE — AB
Staphylococcus aureus: POSITIVE — AB

## 2023-09-08 NOTE — Progress Notes (Signed)
 PCR results sent to Dr. Lequita Halt to review.  ?

## 2023-09-09 ENCOUNTER — Encounter (HOSPITAL_COMMUNITY): Payer: Self-pay

## 2023-09-09 NOTE — Anesthesia Preprocedure Evaluation (Addendum)
 Anesthesia Evaluation  Patient identified by MRN, date of birth, ID band Patient awake    Reviewed: Allergy & Precautions, NPO status , Patient's Chart, lab work & pertinent test results  History of Anesthesia Complications Negative for: history of anesthetic complications  Airway Mallampati: II  TM Distance: >3 FB Neck ROM: Full    Dental  (+) Dental Advisory Given,  Crowns, veneers:   Pulmonary asthma , Current Smoker and Patient abstained from smoking.   Pulmonary exam normal breath sounds clear to auscultation       Cardiovascular hypertension, Pt. on medications Normal cardiovascular exam Rhythm:Regular Rate:Normal     Neuro/Psych  Headaches PSYCHIATRIC DISORDERS Anxiety Depression       GI/Hepatic ,GERD  Medicated and Controlled,,(+)     substance abuse (alcohol  abuse, quit 2019)  alcohol  use  Endo/Other  negative endocrine ROS    Renal/GU negative Renal ROS     Musculoskeletal  (+) Arthritis ,    Abdominal   Peds  Hematology negative hematology ROS (+)   Anesthesia Other Findings   Reproductive/Obstetrics                             Anesthesia Physical Anesthesia Plan  ASA: 2  Anesthesia Plan: Spinal   Post-op Pain Management: Regional block*   Induction: Intravenous  PONV Risk Score and Plan: 1 and Propofol  infusion and Treatment may vary due to age or medical condition  Airway Management Planned: Natural Airway and Simple Face Mask  Additional Equipment:   Intra-op Plan:   Post-operative Plan:   Informed Consent: I have reviewed the patients History and Physical, chart, labs and discussed the procedure including the risks, benefits and alternatives for the proposed anesthesia with the patient or authorized representative who has indicated his/her understanding and acceptance.     Dental advisory given  Plan Discussed with: CRNA and Surgeon  Anesthesia Plan  Comments: (See PAT note from 6/2)        Anesthesia Quick Evaluation

## 2023-09-09 NOTE — Progress Notes (Signed)
 Case: [id number] Date/Time: 09/15/23 0805   Procedure: ARTHROPLASTY, KNEE, TOTAL (Right: Knee)   Anesthesia type: Choice   Pre-op diagnosis: Right Knee Osteoarthritis   Location: [facility] ROOM 10 / [facility]   Surgeons: Liliane Rei, MD       DISCUSSION: [name]  [name] is a 68 yo female who presents to PAT prior to surgery above. PMH of every day smoking, HTN, mild AS, hx of ETOH abuse in remission, depression, anxiety, arthritis.  Patient seen by Cardiology in 09/2020 for pre op clearance prior to R TKA. Echo was ordered due to heart murmur and showed normal LVEF and mild AS with mean gradient of . She was cleared at that time by Dr. Ardell Beauvais however R TKA was not done. Advised to have echo repeated every 2 years but has been lost to f/u.  Patient followed by PCP. Clearance scanned in media on 06/17/23 stating patient is cleared from medical/cardiac perspective and low risk.  VS: BP (!) 142/85   Pulse 70   Temp 36.7 C (Oral)   Resp 16   Ht 5\' 5"  (1.651 m)   Wt 66.2 kg   SpO2 100%   BMI 24.30 kg/m   PROVIDERS: Veda Gerald, MD   LABS: Labs reviewed: Acceptable for surgery. (all labs ordered are listed, but only abnormal results are displayed)  Labs Reviewed  SURGICAL PCR SCREEN - Abnormal; Notable for the following components:      Result Value   MRSA, PCR POSITIVE (*)    Staphylococcus aureus POSITIVE (*)    All other components within normal limits  BASIC METABOLIC PANEL WITH GFR     IMAGES:   EKG 09/08/23:  Normal sinus rhythm, rate 70 Low voltage QRS Cannot rule out Anteroseptal infarct , age undetermined  CV: Echo 10/03/2020:  IMPRESSIONS    1. The aortic valve is tricuspid. There is moderate calcification of the aortic valve. There is moderate thickening of the aortic valve. Aortic valve regurgitation is not visualized. Mild aortic valve stenosis. Aortic valve mean gradient measures 21.0 mmHg. Aortic valve Vmax measures 2.82 m/s.  2. The mitral  valve is abnormal. Mild mitral valve regurgitation. The mean mitral valve gradient is 3.9 mmHg with average heart rate of 73 bpm.  3. Left ventricular ejection fraction, by estimation, is 65 to 70%. Left ventricular ejection fraction by 3D volume is 73 %. The left ventricle has normal function. The left ventricle has no regional wall motion abnormalities. There is mild concentric left ventricular hypertrophy. Left ventricular diastolic parameters are indeterminate. Elevated left atrial pressure. The average left ventricular global longitudinal strain is -23.2 %. The global longitudinal strain is normal.  4. Right ventricular systolic function is normal. The right ventricular size is normal.  5. The inferior vena cava is normal in size with greater than 50% respiratory variability, suggesting right atrial pressure of 3 mmHg.  Comparison(s): No prior Echocardiogram. Past Medical History:  Diagnosis Date   Alcoholism (HCC)    Anemia    Anxiety    Aortic stenosis    Arthritis    Asthma    Depression    GERD (gastroesophageal reflux disease)    Headache    Heart murmur    Hypertension     Past Surgical History:  Procedure Laterality Date   CESAREAN SECTION     x 2   COLONOSCOPY WITH PROPOFOL  N/A 01/17/2021   Procedure: COLONOSCOPY WITH PROPOFOL ;  Surgeon: Urban Garden, MD;  [facility];  Service: Gastroenterology;  Laterality: N/A;  1:50   ESOPHAGOGASTRODUODENOSCOPY (EGD) WITH PROPOFOL  N/A 01/17/2021   Procedure: ESOPHAGOGASTRODUODENOSCOPY (EGD) WITH PROPOFOL ;  Surgeon: Urban Garden, MD;  [facility];  Service: Gastroenterology;  Laterality: N/A;   GASTRIC BYPASS     KNEE ARTHROSCOPY Right    LAPAROSCOPIC HYSTERECTOMY     POLYPECTOMY  01/17/2021   Procedure: POLYPECTOMY;  Surgeon: Umberto Ganong, Bearl Limes, MD;  Location: [facility];  Service: Gastroenterology;;    MEDICATIONS:  acetaminophen  (TYLENOL ) 500 MG tablet    albuterol  (PROVENTIL  HFA;VENTOLIN  HFA) 108 (90 BASE) MCG/ACT inhaler   alendronate (FOSAMAX) 70 MG tablet   fexofenadine (ALLEGRA) 180 MG tablet   fluticasone (FLONASE) 50 MCG/ACT nasal spray   gabapentin (NEURONTIN) 300 MG capsule   gentamicin (GARAMYCIN) 0.3 % ophthalmic solution   hydrochlorothiazide  (MICROZIDE ) 12.5 MG capsule   LORazepam (ATIVAN) 1 MG tablet   meloxicam (MOBIC) 15 MG tablet   Multiple Vitamins-Minerals (ADULT GUMMY PO)   omeprazole  (PRILOSEC) 40 MG capsule   prednisoLONE acetate (PRED FORTE) 1 % ophthalmic suspension   traZODone  (DESYREL ) 100 MG tablet   vitamin B-12 (CYANOCOBALAMIN ) 500 MCG tablet   No current facility-administered medications for this encounter.   Antoinette Kirschner [facility] Surgical Short Stay/Anesthesiology [facility] Phone [phone] 09/09/2023 1:44 PM

## 2023-09-15 ENCOUNTER — Encounter (HOSPITAL_COMMUNITY): Payer: Self-pay | Admitting: Orthopedic Surgery

## 2023-09-15 ENCOUNTER — Observation Stay (HOSPITAL_COMMUNITY)
Admission: RE | Admit: 2023-09-15 | Discharge: 2023-09-16 | Disposition: A | Discharge status: Home / Self Care | Attending: Orthopedic Surgery | Admitting: Orthopedic Surgery

## 2023-09-15 ENCOUNTER — Other Ambulatory Visit: Payer: Self-pay

## 2023-09-15 ENCOUNTER — Ambulatory Visit (HOSPITAL_COMMUNITY): Payer: Self-pay | Admitting: Physician Assistant

## 2023-09-15 ENCOUNTER — Encounter (HOSPITAL_COMMUNITY)
Admission: RE | Disposition: A | Discharge status: Home / Self Care | Payer: Self-pay | Source: Home / Self Care | Attending: Orthopedic Surgery

## 2023-09-15 ENCOUNTER — Ambulatory Visit (HOSPITAL_COMMUNITY): Admitting: Anesthesiology

## 2023-09-15 DIAGNOSIS — I10 Essential (primary) hypertension: Secondary | ICD-10-CM | POA: Diagnosis not present

## 2023-09-15 DIAGNOSIS — M1711 Unilateral primary osteoarthritis, right knee: Secondary | ICD-10-CM

## 2023-09-15 DIAGNOSIS — Z79899 Other long term (current) drug therapy: Secondary | ICD-10-CM | POA: Insufficient documentation

## 2023-09-15 DIAGNOSIS — J45909 Unspecified asthma, uncomplicated: Secondary | ICD-10-CM | POA: Diagnosis not present

## 2023-09-15 DIAGNOSIS — M179 Osteoarthritis of knee, unspecified: Principal | ICD-10-CM | POA: Diagnosis present

## 2023-09-15 DIAGNOSIS — F17210 Nicotine dependence, cigarettes, uncomplicated: Secondary | ICD-10-CM | POA: Diagnosis not present

## 2023-09-15 HISTORY — PX: TOTAL KNEE ARTHROPLASTY: SHX125

## 2023-09-15 SURGERY — ARTHROPLASTY, KNEE, TOTAL
Anesthesia: Spinal | Site: Knee | Laterality: Right

## 2023-09-15 MED ORDER — ASPIRIN 81 MG PO CHEW
81.0000 mg | CHEWABLE_TABLET | Freq: Two times a day (BID) | ORAL | Status: DC
  Administered 2023-09-16: 81 mg via ORAL
  Filled 2023-09-15: qty 1

## 2023-09-15 MED ORDER — DOCUSATE SODIUM 100 MG PO CAPS
100.0000 mg | ORAL_CAPSULE | Freq: Two times a day (BID) | ORAL | Status: DC
Start: 2023-09-15 — End: 2023-09-16
  Administered 2023-09-15 – 2023-09-16 (×2): 100 mg via ORAL
  Filled 2023-09-15 (×2): qty 1

## 2023-09-15 MED ORDER — FLEET ENEMA RE ENEM: 1.0000 | ENEMA | Freq: Once | RECTAL | Status: DC | PRN

## 2023-09-15 MED ORDER — FENTANYL CITRATE (PF) 100 MCG/2ML IJ SOLN
INTRAMUSCULAR | Status: DC | PRN
  Administered 2023-09-15 (×4): 25 ug via INTRAVENOUS

## 2023-09-15 MED ORDER — HYDROMORPHONE HCL 1 MG/ML IJ SOLN
INTRAMUSCULAR | Status: AC
  Filled 2023-09-15: qty 1

## 2023-09-15 MED ORDER — ORAL CARE MOUTH RINSE: 15.0000 mL | OROMUCOSAL | Status: DC | PRN

## 2023-09-15 MED ORDER — ORAL CARE MOUTH RINSE
15.0000 mL | Freq: Once | OROMUCOSAL | Status: AC
Start: 2023-09-15 — End: 2023-09-15

## 2023-09-15 MED ORDER — POVIDONE-IODINE 10 % EX SWAB: 2.0000 | Freq: Once | CUTANEOUS | Status: DC

## 2023-09-15 MED ORDER — ALBUTEROL SULFATE HFA 108 (90 BASE) MCG/ACT IN AERS
2.0000 | INHALATION_SPRAY | Freq: Four times a day (QID) | RESPIRATORY_TRACT | Status: DC | PRN
  Administered 2023-09-15 – 2023-09-16 (×2): 2 via RESPIRATORY_TRACT
  Filled 2023-09-15: qty 6.7

## 2023-09-15 MED ORDER — 0.9 % SODIUM CHLORIDE (POUR BTL) OPTIME
TOPICAL | Status: DC | PRN
Start: 2023-09-15 — End: 2023-09-15
  Administered 2023-09-15: 1000 mL

## 2023-09-15 MED ORDER — CHLORHEXIDINE GLUCONATE 0.12 % MT SOLN
15.0000 mL | Freq: Once | OROMUCOSAL | Status: AC
  Administered 2023-09-15: 15 mL via OROMUCOSAL

## 2023-09-15 MED ORDER — BUPIVACAINE LIPOSOME 1.3 % IJ SUSP: 20.0000 mL | Freq: Once | INTRAMUSCULAR | Status: DC

## 2023-09-15 MED ORDER — SODIUM CHLORIDE 0.9 % IV SOLN
INTRAVENOUS | Status: DC | PRN
  Administered 2023-09-15: 80 mL

## 2023-09-15 MED ORDER — METHOCARBAMOL 1000 MG/10ML IJ SOLN
500.0000 mg | Freq: Four times a day (QID) | INTRAMUSCULAR | Status: DC | PRN
  Administered 2023-09-15: 500 mg via INTRAVENOUS

## 2023-09-15 MED ORDER — ACETAMINOPHEN 500 MG PO TABS
1000.0000 mg | ORAL_TABLET | Freq: Four times a day (QID) | ORAL | Status: DC
  Administered 2023-09-15 – 2023-09-16 (×3): 1000 mg via ORAL
  Filled 2023-09-15 (×3): qty 2

## 2023-09-15 MED ORDER — ONDANSETRON HCL 4 MG/2ML IJ SOLN
INTRAMUSCULAR | Status: AC
  Filled 2023-09-15: qty 2

## 2023-09-15 MED ORDER — CEFAZOLIN SODIUM-DEXTROSE 2-4 GM/100ML-% IV SOLN
2.0000 g | Freq: Four times a day (QID) | INTRAVENOUS | Status: AC
  Administered 2023-09-15 (×2): 2 g via INTRAVENOUS
  Filled 2023-09-15 (×2): qty 100

## 2023-09-15 MED ORDER — HYDROMORPHONE HCL 1 MG/ML IJ SOLN
0.2500 mg | INTRAMUSCULAR | Status: DC | PRN
  Administered 2023-09-15 (×2): 0.5 mg via INTRAVENOUS

## 2023-09-15 MED ORDER — HYDROCHLOROTHIAZIDE 12.5 MG PO CAPS
12.5000 mg | ORAL_CAPSULE | Freq: Every day | ORAL | Status: DC
  Filled 2023-09-15: qty 1

## 2023-09-15 MED ORDER — METHOCARBAMOL 1000 MG/10ML IJ SOLN
INTRAMUSCULAR | Status: AC
  Filled 2023-09-15: qty 10

## 2023-09-15 MED ORDER — PROPOFOL 1000 MG/100ML IV EMUL
INTRAVENOUS | Status: AC
  Filled 2023-09-15: qty 100

## 2023-09-15 MED ORDER — DIPHENHYDRAMINE HCL 12.5 MG/5ML PO ELIX: 12.5000 mg | ORAL_SOLUTION | ORAL | Status: DC | PRN

## 2023-09-15 MED ORDER — ACETAMINOPHEN 325 MG PO TABS: 325.0000 mg | ORAL_TABLET | Freq: Four times a day (QID) | ORAL | Status: DC | PRN

## 2023-09-15 MED ORDER — ACETAMINOPHEN 10 MG/ML IV SOLN
1000.0000 mg | Freq: Once | INTRAVENOUS | Status: DC | PRN
  Administered 2023-09-15: 1000 mg via INTRAVENOUS

## 2023-09-15 MED ORDER — HYDROMORPHONE HCL 1 MG/ML IJ SOLN
INTRAMUSCULAR | Status: AC
  Filled 2023-09-15: qty 2

## 2023-09-15 MED ORDER — LACTATED RINGERS IV SOLN: INTRAVENOUS | Status: DC

## 2023-09-15 MED ORDER — POLYETHYLENE GLYCOL 3350 17 G PO PACK: 17.0000 g | PACK | Freq: Every day | ORAL | Status: DC | PRN

## 2023-09-15 MED ORDER — FENTANYL CITRATE (PF) 100 MCG/2ML IJ SOLN
INTRAMUSCULAR | Status: AC
  Filled 2023-09-15: qty 2

## 2023-09-15 MED ORDER — ACETAMINOPHEN 10 MG/ML IV SOLN
INTRAVENOUS | Status: AC
Start: 2023-09-15 — End: ?
  Filled 2023-09-15: qty 100

## 2023-09-15 MED ORDER — CEFAZOLIN SODIUM-DEXTROSE 2-4 GM/100ML-% IV SOLN
2.0000 g | INTRAVENOUS | Status: AC
  Administered 2023-09-15: 2 g via INTRAVENOUS
  Filled 2023-09-15: qty 100

## 2023-09-15 MED ORDER — SODIUM CHLORIDE 0.9 % IV SOLN: 12.5000 mg | INTRAVENOUS | Status: DC | PRN

## 2023-09-15 MED ORDER — CHLORHEXIDINE GLUCONATE 4 % EX SOLN
1.0000 | CUTANEOUS | 1 refills | Status: AC
Start: 2023-09-15 — End: ?

## 2023-09-15 MED ORDER — PROPOFOL 500 MG/50ML IV EMUL
INTRAVENOUS | Status: DC | PRN
  Administered 2023-09-15: 30 ug/kg/min via INTRAVENOUS

## 2023-09-15 MED ORDER — FENTANYL CITRATE PF 50 MCG/ML IJ SOSY
100.0000 ug | PREFILLED_SYRINGE | INTRAMUSCULAR | Status: DC
  Administered 2023-09-15: 100 ug via INTRAVENOUS
  Filled 2023-09-15: qty 2

## 2023-09-15 MED ORDER — SODIUM CHLORIDE 0.9 % IR SOLN
Status: DC | PRN
Start: 2023-09-15 — End: 2023-09-15
  Administered 2023-09-15: 1000 mL

## 2023-09-15 MED ORDER — PROPOFOL 10 MG/ML IV BOLUS
INTRAVENOUS | Status: AC
  Filled 2023-09-15: qty 20

## 2023-09-15 MED ORDER — OXYCODONE HCL 5 MG PO TABS: 5.0000 mg | ORAL_TABLET | Freq: Once | ORAL | Status: DC | PRN

## 2023-09-15 MED ORDER — ONDANSETRON HCL 4 MG PO TABS: 4.0000 mg | ORAL_TABLET | Freq: Four times a day (QID) | ORAL | Status: DC | PRN

## 2023-09-15 MED ORDER — VANCOMYCIN HCL IN DEXTROSE 1-5 GM/200ML-% IV SOLN
1000.0000 mg | INTRAVENOUS | Status: AC
  Administered 2023-09-15: 1000 mg via INTRAVENOUS
  Filled 2023-09-15: qty 200

## 2023-09-15 MED ORDER — MENTHOL 3 MG MT LOZG: 1.0000 | LOZENGE | OROMUCOSAL | Status: DC | PRN

## 2023-09-15 MED ORDER — BUPIVACAINE IN DEXTROSE 0.75-8.25 % IT SOLN
INTRATHECAL | Status: DC | PRN
  Administered 2023-09-15: 2 mL via INTRATHECAL

## 2023-09-15 MED ORDER — LORAZEPAM 1 MG PO TABS
1.0000 mg | ORAL_TABLET | Freq: Every day | ORAL | Status: DC
  Administered 2023-09-15: 1 mg via ORAL
  Filled 2023-09-15: qty 1

## 2023-09-15 MED ORDER — PHENOL 1.4 % MT LIQD
1.0000 | OROMUCOSAL | Status: DC | PRN
Start: 2023-09-15 — End: 2023-09-16

## 2023-09-15 MED ORDER — ONDANSETRON HCL 4 MG/2ML IJ SOLN
INTRAMUSCULAR | Status: DC | PRN
  Administered 2023-09-15: 4 mg via INTRAVENOUS

## 2023-09-15 MED ORDER — LIDOCAINE HCL (PF) 2 % IJ SOLN
INTRAMUSCULAR | Status: DC | PRN
  Administered 2023-09-15: 30 mg via INTRADERMAL

## 2023-09-15 MED ORDER — PROPOFOL 10 MG/ML IV BOLUS
INTRAVENOUS | Status: DC | PRN
Start: 2023-09-15 — End: 2023-09-15
  Administered 2023-09-15: 30 mg via INTRAVENOUS
  Administered 2023-09-15: 120 mg via INTRAVENOUS

## 2023-09-15 MED ORDER — SODIUM CHLORIDE (PF) 0.9 % IJ SOLN
INTRAMUSCULAR | Status: AC
  Filled 2023-09-15: qty 50

## 2023-09-15 MED ORDER — DEXAMETHASONE SODIUM PHOSPHATE 10 MG/ML IJ SOLN
INTRAMUSCULAR | Status: AC
  Filled 2023-09-15: qty 2

## 2023-09-15 MED ORDER — METOCLOPRAMIDE HCL 5 MG PO TABS: 5.0000 mg | ORAL_TABLET | Freq: Three times a day (TID) | ORAL | Status: DC | PRN

## 2023-09-15 MED ORDER — PHENYLEPHRINE HCL-NACL 20-0.9 MG/250ML-% IV SOLN
INTRAVENOUS | Status: DC | PRN
Start: 2023-09-15 — End: 2023-09-15
  Administered 2023-09-15: 50 ug/min via INTRAVENOUS

## 2023-09-15 MED ORDER — MUPIROCIN 2 % EX OINT: 1.0000 | TOPICAL_OINTMENT | Freq: Two times a day (BID) | CUTANEOUS | 0 refills | Status: AC

## 2023-09-15 MED ORDER — METOCLOPRAMIDE HCL 5 MG/ML IJ SOLN: 5.0000 mg | Freq: Three times a day (TID) | INTRAMUSCULAR | Status: DC | PRN

## 2023-09-15 MED ORDER — DEXAMETHASONE SODIUM PHOSPHATE 10 MG/ML IJ SOLN
10.0000 mg | Freq: Once | INTRAMUSCULAR | Status: AC
  Administered 2023-09-16: 10 mg via INTRAVENOUS
  Filled 2023-09-15: qty 1

## 2023-09-15 MED ORDER — TRANEXAMIC ACID-NACL 1000-0.7 MG/100ML-% IV SOLN
1000.0000 mg | Freq: Once | INTRAVENOUS | Status: AC
  Administered 2023-09-15: 1000 mg via INTRAVENOUS
  Filled 2023-09-15: qty 100

## 2023-09-15 MED ORDER — METHOCARBAMOL 500 MG PO TABS
500.0000 mg | ORAL_TABLET | Freq: Four times a day (QID) | ORAL | Status: DC | PRN
  Administered 2023-09-15 – 2023-09-16 (×3): 500 mg via ORAL
  Filled 2023-09-15 (×3): qty 1

## 2023-09-15 MED ORDER — PANTOPRAZOLE SODIUM 40 MG PO TBEC
40.0000 mg | DELAYED_RELEASE_TABLET | Freq: Every day | ORAL | Status: DC
  Administered 2023-09-16: 40 mg via ORAL
  Filled 2023-09-15: qty 1

## 2023-09-15 MED ORDER — TRANEXAMIC ACID-NACL 1000-0.7 MG/100ML-% IV SOLN
1000.0000 mg | INTRAVENOUS | Status: AC
  Administered 2023-09-15: 1000 mg via INTRAVENOUS
  Filled 2023-09-15: qty 100

## 2023-09-15 MED ORDER — ROPIVACAINE HCL 5 MG/ML IJ SOLN
INTRAMUSCULAR | Status: DC | PRN
Start: 2023-09-15 — End: 2023-09-15
  Administered 2023-09-15: 20 mL via PERINEURAL

## 2023-09-15 MED ORDER — SODIUM CHLORIDE 0.9 % IV SOLN: INTRAVENOUS | Status: DC

## 2023-09-15 MED ORDER — BISACODYL 10 MG RE SUPP: 10.0000 mg | Freq: Every day | RECTAL | Status: DC | PRN

## 2023-09-15 MED ORDER — ACETAMINOPHEN 10 MG/ML IV SOLN
1000.0000 mg | Freq: Four times a day (QID) | INTRAVENOUS | Status: DC
  Filled 2023-09-15: qty 100

## 2023-09-15 MED ORDER — BUPIVACAINE LIPOSOME 1.3 % IJ SUSP
INTRAMUSCULAR | Status: AC
  Filled 2023-09-15: qty 20

## 2023-09-15 MED ORDER — MIDAZOLAM HCL 2 MG/2ML IJ SOLN
2.0000 mg | INTRAMUSCULAR | Status: DC
  Administered 2023-09-15: 2 mg via INTRAVENOUS
  Filled 2023-09-15: qty 2

## 2023-09-15 MED ORDER — OXYCODONE HCL 5 MG/5ML PO SOLN: 5.0000 mg | Freq: Once | ORAL | Status: DC | PRN

## 2023-09-15 MED ORDER — ONDANSETRON HCL 4 MG/2ML IJ SOLN: 4.0000 mg | Freq: Four times a day (QID) | INTRAMUSCULAR | Status: DC | PRN

## 2023-09-15 MED ORDER — TRAZODONE HCL 100 MG PO TABS
100.0000 mg | ORAL_TABLET | Freq: Every day | ORAL | Status: DC
  Administered 2023-09-15: 100 mg via ORAL
  Filled 2023-09-15: qty 1

## 2023-09-15 MED ORDER — DEXAMETHASONE SODIUM PHOSPHATE 10 MG/ML IJ SOLN
8.0000 mg | Freq: Once | INTRAMUSCULAR | Status: AC
  Administered 2023-09-15: 8 mg via INTRAVENOUS

## 2023-09-15 MED ORDER — GABAPENTIN 300 MG PO CAPS
900.0000 mg | ORAL_CAPSULE | Freq: Every day | ORAL | Status: DC
  Administered 2023-09-15: 900 mg via ORAL
  Filled 2023-09-15: qty 3

## 2023-09-15 MED ORDER — OXYCODONE HCL 5 MG PO TABS
5.0000 mg | ORAL_TABLET | ORAL | Status: DC | PRN
  Administered 2023-09-15 – 2023-09-16 (×5): 10 mg via ORAL
  Administered 2023-09-16: 5 mg via ORAL
  Filled 2023-09-15 (×6): qty 2

## 2023-09-15 MED ORDER — HYDROMORPHONE HCL 1 MG/ML IJ SOLN
0.5000 mg | INTRAMUSCULAR | Status: DC | PRN
  Administered 2023-09-15 – 2023-09-16 (×4): 1 mg via INTRAVENOUS
  Filled 2023-09-15 (×4): qty 1

## 2023-09-15 MED ORDER — HYDROMORPHONE HCL 1 MG/ML IJ SOLN
0.2500 mg | INTRAMUSCULAR | Status: DC | PRN
  Administered 2023-09-15 (×4): 0.5 mg via INTRAVENOUS

## 2023-09-15 MED ORDER — TRAMADOL HCL 50 MG PO TABS: 50.0000 mg | ORAL_TABLET | Freq: Four times a day (QID) | ORAL | Status: DC | PRN

## 2023-09-15 SURGICAL SUPPLY — 46 items
ATTUNE PS FEM RT SZ 5 CEM KNEE (Femur) IMPLANT
ATTUNE PSRP INSR SZ 5 10M KNEE (Insert) IMPLANT
BAG COUNTER SPONGE SURGICOUNT (BAG) IMPLANT
BAG ZIPLOCK 12X15 (MISCELLANEOUS) ×2 IMPLANT
BASEPLATE TIBIAL ROTATING SZ 4 (Knees) IMPLANT
BLADE SAG 18X100X1.27 (BLADE) ×2 IMPLANT
BLADE SAW SGTL 11.0X1.19X90.0M (BLADE) ×2 IMPLANT
BNDG ELASTIC 6INX 5YD STR LF (GAUZE/BANDAGES/DRESSINGS) ×2 IMPLANT
BOWL SMART MIX CTS (DISPOSABLE) ×2 IMPLANT
CEMENT HV SMART SET (Cement) ×4 IMPLANT
COVER SURGICAL LIGHT HANDLE (MISCELLANEOUS) ×2 IMPLANT
CUFF TRNQT CYL 34X4.125X (TOURNIQUET CUFF) ×2 IMPLANT
DERMABOND ADVANCED .7 DNX12 (GAUZE/BANDAGES/DRESSINGS) ×2 IMPLANT
DRAPE U-SHAPE 47X51 STRL (DRAPES) ×2 IMPLANT
DRSG AQUACEL AG ADV 3.5X10 (GAUZE/BANDAGES/DRESSINGS) ×2 IMPLANT
DURAPREP 26ML APPLICATOR (WOUND CARE) ×2 IMPLANT
ELECT PENCIL ROCKER SW 15FT (MISCELLANEOUS) ×2 IMPLANT
ELECT REM PT RETURN 15FT ADLT (MISCELLANEOUS) ×2 IMPLANT
GLOVE BIO SURGEON STRL SZ 6.5 (GLOVE) IMPLANT
GLOVE BIO SURGEON STRL SZ7 (GLOVE) IMPLANT
GLOVE BIO SURGEON STRL SZ8 (GLOVE) ×2 IMPLANT
GLOVE BIOGEL PI IND STRL 7.0 (GLOVE) ×2 IMPLANT
GLOVE BIOGEL PI IND STRL 8 (GLOVE) ×2 IMPLANT
GOWN STRL REUS W/ TWL LRG LVL3 (GOWN DISPOSABLE) ×2 IMPLANT
HOLDER FOLEY CATH W/STRAP (MISCELLANEOUS) ×2 IMPLANT
IMMOBILIZER KNEE 20 (SOFTGOODS) ×1 IMPLANT
IMMOBILIZER KNEE 20 THIGH 36 (SOFTGOODS) ×2 IMPLANT
KIT TURNOVER KIT A (KITS) ×2 IMPLANT
MANIFOLD NEPTUNE II (INSTRUMENTS) ×2 IMPLANT
NS IRRIG 1000ML POUR BTL (IV SOLUTION) ×2 IMPLANT
PACK TOTAL KNEE CUSTOM (KITS) ×2 IMPLANT
PADDING CAST COTTON 6X4 STRL (CAST SUPPLIES) ×4 IMPLANT
PATELLA MEDIAL ATTUN 35MM KNEE (Knees) IMPLANT
PENCIL SMOKE EVACUATOR (MISCELLANEOUS) IMPLANT
PIN STEINMAN FIXATION KNEE (PIN) IMPLANT
PROTECTOR NERVE ULNAR (MISCELLANEOUS) ×2 IMPLANT
SET HNDPC FAN SPRY TIP SCT (DISPOSABLE) ×2 IMPLANT
SUT MNCRL AB 4-0 PS2 18 (SUTURE) ×2 IMPLANT
SUT VIC AB 2-0 CT1 TAPERPNT 27 (SUTURE) ×6 IMPLANT
SUTURE STRATFX 0 PDS 27 VIOLET (SUTURE) ×2 IMPLANT
TOWEL GREEN STERILE FF (TOWEL DISPOSABLE) ×2 IMPLANT
TRAY FOLEY MTR SLVR 14FR STAT (SET/KITS/TRAYS/PACK) IMPLANT
TRAY FOLEY MTR SLVR 16FR STAT (SET/KITS/TRAYS/PACK) ×2 IMPLANT
TUBE SUCTION HIGH CAP CLEAR NV (SUCTIONS) ×2 IMPLANT
WATER STERILE IRR 1000ML POUR (IV SOLUTION) ×4 IMPLANT
WRAP KNEE MAXI GEL POST OP (GAUZE/BANDAGES/DRESSINGS) ×2 IMPLANT

## 2023-09-15 NOTE — Progress Notes (Signed)
 Orthopedic Tech Progress Note Patient Details:  [name] 14-Jul-1955 [id number]  CPM Right Knee CPM Right Knee: On Right Knee Flexion (Degrees): 40 Right Knee Extension (Degrees): 10  Post Interventions Patient Tolerated: Poor  [name] 09/15/2023, 10:17 AM

## 2023-09-15 NOTE — Anesthesia Postprocedure Evaluation (Signed)
 Anesthesia Post Note  Patient: Nekia  Dominique  Procedure(s) Performed: ARTHROPLASTY, KNEE, TOTAL (Right: Knee)     Patient location during evaluation: PACU Anesthesia Type: General Level of consciousness: awake and alert Pain management: pain level controlled Vital Signs Assessment: post-procedure vital signs reviewed and stable Respiratory status: spontaneous breathing, nonlabored ventilation and respiratory function stable Cardiovascular status: blood pressure returned to baseline and stable Postop Assessment: no apparent nausea or vomiting Anesthetic complications: no   No notable events documented.  Last Vitals:  Vitals:   09/15/23 1100 09/15/23 1120  BP: 112/70 120/78  Pulse: 69 74  Resp: 13 19  Temp: 36.6 C 36.7 C  SpO2: 98%     Last Pain:  Vitals:   09/15/23 1120  TempSrc: Oral  PainSc:                  Earvin Goldberg

## 2023-09-15 NOTE — Anesthesia Procedure Notes (Signed)
 Spinal  Patient location during procedure: OR Start time: 09/15/2023 8:21 AM End time: 09/15/2023 8:26 AM Reason for block: surgical anesthesia Staffing Performed: anesthesiologist  Anesthesiologist: Earvin Goldberg, MD Performed by: Earvin Goldberg, MD Authorized by: Earvin Goldberg, MD   Preanesthetic Checklist Completed: patient identified, IV checked, site marked, risks and benefits discussed, surgical consent, monitors and equipment checked, pre-op evaluation and timeout performed Spinal Block Patient position: sitting Prep: DuraPrep Patient monitoring: heart rate, cardiac monitor, continuous pulse ox and blood pressure Approach: midline Location: L3-4 Injection technique: single-shot Needle Needle type: Sprotte  Needle gauge: 24 G Needle length: 9 cm Assessment Sensory level: T4 Events: CSF return

## 2023-09-15 NOTE — Progress Notes (Signed)
 Orthopedic Tech Progress Note Patient Details:  [name] 05/29/1955 [id number]  CPM Right Knee CPM Right Knee: Off Right Knee Flexion (Degrees): 40 Right Knee Extension (Degrees): 10  Post Interventions Patient Tolerated: Poor Arrived to remove patient from CPM and found that the patient or family member had unstrapped the cpm and the patient was laying on her side with her leg resting in the machine. Patient requesting a pillow for under her knee and the importance of NOT doing this was stressed to her. NT made aware of patient request. [name] 09/15/2023, 2:15 PM

## 2023-09-15 NOTE — Care Plan (Signed)
 Ortho Bundle Case Management Note  Patient Details  Name: Nyhla  Que MRN: [id number] Date of Birth: 1955-08-19  RT TKA on 09/15/23  DCP: Home with daughter DME: RW, ordered through [employer] PT: EO [place] 6/12                   DME Arranged:  Otho Blitz rolling DME Agency:  [employer]  HH Arranged:    HH Agency:     Additional Comments: Please contact me with any questions of if this plan should need to change.  Kathlene Paradise, Case Manager [facility] [phone] Ext. [phone]   09/15/2023, 8:08 AM

## 2023-09-15 NOTE — Evaluation (Signed)
 Physical Therapy Evaluation Patient Details Name: [name]  [name] MRN: [id number] DOB: [birth year] Today's Date: 09/15/2023  History of Present Illness  68 yo female s/p R TKA on 09/15/23. PMH: tobacco use, ETOH abuse, anemia, anxiety, depression, HTN  Clinical Impression  Pt is s/p TKA resulting in the deficits listed below (see PT Problem List).  Pt in bed on PT arrival, restless and reporting incr pain. Agreeable to PT.  Pt amb 30' with RW and CGA, distance limited by incr pain with mobility. Pt premedicated prior to PT, RN aware of requests for pain meds when possible. Ice to R knee EOS.  Pt will benefit from acute skilled PT to increase their independence and safety with mobility to allow discharge.          If plan is discharge home, recommend the following: A little help with walking and/or transfers;A little help with bathing/dressing/bathroom;Help with stairs or ramp for entrance;Assist for transportation   Can travel by private vehicle        Equipment Recommendations Rolling walker (2 wheels)  Recommendations for Other Services       Functional Status Assessment Patient has had a recent decline in their functional status and demonstrates the ability to make significant improvements in function in a reasonable and predictable amount of time.     Precautions / Restrictions Precautions Precautions: Fall;Knee Restrictions Weight Bearing Restrictions Per Provider Order: No Other Position/Activity Restrictions: WBAT      Mobility  Bed Mobility Overal bed mobility: Needs Assistance Bed Mobility: Supine to Sit     Supine to sit: Supervision     General bed mobility comments: for lines and safety    Transfers Overall transfer level: Needs assistance Equipment used: Rolling walker (2 wheels) Transfers: Sit to/from Stand Sit to Stand: Contact guard assist, Min assist           General transfer comment: cues for hand placement     Ambulation/Gait Ambulation/Gait assistance: Contact guard assist Gait Distance (Feet): 30 Feet Assistive device: Rolling walker (2 wheels) Gait Pattern/deviations: Step-to pattern, Decreased stance time - right       General Gait Details: cues for sequence and RW position, distance limited by pain  Stairs            Wheelchair Mobility     Tilt Bed    Modified Rankin (Stroke Patients Only)       Balance Overall balance assessment: No apparent balance deficits (not formally assessed)                                           Pertinent Vitals/Pain Pain Assessment Pain Assessment: 0-10 Pain Score: 7  Pain Location: right knee Pain Descriptors / Indicators: Aching, Sore Pain Intervention(s): Limited activity within patient's tolerance, Monitored during session, Premedicated before session    Home Living Family/patient expects to be discharged to:: Private residence Living Arrangements: Other relatives;Children Available Help at Discharge: Family;Available 24 hours/day Type of Home: House Home Access: Stairs to enter Entrance Stairs-Rails: Right Entrance Stairs-Number of Steps: 5   Home Layout: One level Home Equipment: None      Prior Function Prior Level of Function : Independent/Modified Independent                     Extremity/Trunk Assessment   Upper Extremity Assessment Upper Extremity Assessment: Overall WFL for tasks assessed  Lower Extremity Assessment Lower Extremity Assessment: RLE deficits/detail RLE Deficits / Details: ankle  WFL, knee extension and hip flexion 3/5, ltd by anticpated post op deficits and pain       Communication   Communication Communication: No apparent difficulties    Cognition Arousal: Alert Behavior During Therapy: Anxious   PT - Cognitive impairments: No apparent impairments                         Following commands: Intact       Cueing Cueing Techniques:  Verbal cues     General Comments      Exercises Total Joint Exercises Ankle Circles/Pumps: AROM, Both, 5 reps Heel Slides:  (pt flexing R knee on arrival)   Assessment/Plan    PT Assessment Patient needs continued PT services  PT Problem List Decreased strength;Decreased range of motion;Decreased activity tolerance;Decreased knowledge of use of DME;Pain;Decreased mobility       PT Treatment Interventions DME instruction;Therapeutic exercise;Gait training;Functional mobility training;Therapeutic activities;Patient/family education;Stair training    PT Goals (Current goals can be found in the Care Plan section)  Acute Rehab PT Goals PT Goal Formulation: With patient Time For Goal Achievement: 09/22/23 Potential to Achieve Goals: Good    Frequency 7X/week     Co-evaluation               AM-PAC PT "6 Clicks" Mobility  Outcome Measure Help needed turning from your back to your side while in a flat bed without using bedrails?: A Little Help needed moving from lying on your back to sitting on the side of a flat bed without using bedrails?: A Little Help needed moving to and from a bed to a chair (including a wheelchair)?: A Little Help needed standing up from a chair using your arms (e.g., wheelchair or bedside chair)?: A Little Help needed to walk in hospital room?: A Little Help needed climbing 3-5 steps with a railing? : A Lot 6 Click Score: 17    End of Session Equipment Utilized During Treatment: Gait belt Activity Tolerance: Patient tolerated treatment well;Patient limited by pain Patient left: in chair;with call bell/phone within reach;with family/visitor present;with chair alarm set Nurse Communication: Mobility status;Patient requests pain meds PT Visit Diagnosis: Other abnormalities of gait and mobility (R26.89);Difficulty in walking, not elsewhere classified (R26.2)    Time: 2956-2130 PT Time Calculation (min) (ACUTE ONLY): 28 min   Charges:   PT  Evaluation $PT Eval Low Complexity: 1 Low PT Treatments $Gait Training: 8-22 mins PT General Charges $$ ACUTE PT VISIT: 1 Visit         Alvey Brockel, PT  Acute Rehab Dept [facility]) [phone]  09/15/2023   [facility] 09/15/2023, 3:20 PM

## 2023-09-15 NOTE — Op Note (Signed)
 OPERATIVE REPORT-TOTAL KNEE ARTHROPLASTY   Pre-operative diagnosis- Osteoarthritis  Right knee(s)  Post-operative diagnosis- Osteoarthritis Right knee(s)  Procedure-  Right  Total Knee Arthroplasty  Surgeon- Henri Loft. Kawon Willcutt, MD  Assistant- Sharlynn Dear, PA-C   Anesthesia-  GA combined with regional for post-op pain  EBL-25 ml   Drains None  Tourniquet time-  Total Tourniquet Time Documented: Thigh (Right) - 37 minutes Total: Thigh (Right) - 37 minutes     Complications- None  Condition-PACU - hemodynamically stable.   Brief Clinical Note  [name]  [name] is a 68 y.o. year old female with end stage OA of her right knee with progressively worsening pain and dysfunction. She has constant pain, with activity and at rest and significant functional deficits with difficulties even with ADLs. She has had extensive non-op management including analgesics, injections of cortisone, and home exercise program, but remains in significant pain with significant dysfunction.Radiographs show bone on bone arthritis all3 compartments. She presents now for right Total Knee Arthroplasty.     Procedure in detail---   The patient is brought into the operating room and positioned supine on the operating table. After successful administration of  GA combined with regional for post-op pain,   a tourniquet is placed high on the  Right thigh(s) and the lower extremity is prepped and draped in the usual sterile fashion. Time out is performed by the operating team and then the  Right lower extremity is wrapped in Esmarch, knee flexed and the tourniquet inflated to 300 mmHg.       A midline incision is made with a ten blade through the subcutaneous tissue to the level of the extensor mechanism. A fresh blade is used to make a medial parapatellar arthrotomy. Soft tissue over the proximal medial tibia is subperiosteally elevated to the joint line with a knife and into the semimembranosus bursa with a Cobb  elevator. Soft tissue over the proximal lateral tibia is elevated with attention being paid to avoiding the patellar tendon on the tibial tubercle. The patella is everted, knee flexed 90 degrees and the ACL and PCL are removed. Findings are bone on bone all 3 compartments with large global osteophytes        The drill is used to create a starting hole in the distal femur and the canal is thoroughly irrigated with sterile saline to remove the fatty contents. The 5 degree Right  valgus alignment guide is placed into the femoral canal and the distal femoral cutting block is pinned to remove 10 mm off the distal femur. Resection is made with an oscillating saw.      The tibia is subluxed forward and the menisci are removed. The extramedullary alignment guide is placed referencing proximally at the medial aspect of the tibial tubercle and distally along the second metatarsal axis and tibial crest. The block is pinned to remove 2mm off the more deficient medial  side. Resection is made with an oscillating saw. Size 4is the most appropriate size for the tibia and the proximal tibia is prepared with the modular drill and keel punch for that size.      The femoral sizing guide is placed and size 5 is most appropriate. Rotation is marked off the epicondylar axis and confirmed by creating a rectangular flexion gap at 90 degrees. The size 5 cutting block is pinned in this rotation and the anterior, posterior and chamfer cuts are made with the oscillating saw. The intercondylar block is then placed and that cut is made.  Trial size 4 tibial component, trial size 5 posterior stabilized femur and a 10  mm posterior stabilized rotating platform insert trial is placed. Full extension is achieved with excellent varus/valgus and anterior/posterior balance throughout full range of motion. The patella is everted and thickness measured to be 22  mm. Free hand resection is taken to 12 mm, a 35 template is placed, lug holes are  drilled, trial patella is placed, and it tracks normally. Osteophytes are removed off the posterior femur with the trial in place. All trials are removed and the cut bone surfaces prepared with pulsatile lavage. Cement is mixed and once ready for implantation, the size 4 tibial implant, size  5 posterior stabilized femoral component, and the size 35 patella are cemented in place and the patella is held with the clamp. The trial insert is placed and the knee held in full extension. The Exparel  (20 ml mixed with 60 ml saline) is injected into the extensor mechanism, posterior capsule, medial and lateral gutters and subcutaneous tissues.  All extruded cement is removed and once the cement is hard the permanent 10 mm posterior stabilized rotating platform insert is placed into the tibial tray.      The wound is copiously irrigated with saline solution and the extensor mechanism closed with # 0 Stratofix suture. The tourniquet is released for a total tourniquet time of 37  minutes. Flexion against gravity is 140 degrees and the patella tracks normally. Subcutaneous tissue is closed with 2.0 vicryl and subcuticular with running 4.0 Monocryl. The incision is cleaned and dried and steri-strips and a bulky sterile dressing are applied. The limb is placed into a knee immobilizer and the patient is awakened and transported to recovery in stable condition.      Please note that a surgical assistant was a medical necessity for this procedure in order to perform it in a safe and expeditious manner. Surgical assistant was necessary to retract the ligaments and vital neurovascular structures to prevent injury to them and also necessary for proper positioning of the limb to allow for anatomic placement of the prosthesis.   Henri Loft Belva Koziel, MD    09/15/2023, 9:28 AM

## 2023-09-15 NOTE — Discharge Instructions (Signed)
[name] Arabian, MD Total Joint Specialist [facility] [address], [address] [place], [place] [address] [phone]  TOTAL KNEE REPLACEMENT POSTOPERATIVE DIRECTIONS    Knee Rehabilitation, Guidelines Following Surgery  Results after knee surgery are often greatly improved when you follow the exercise, range of motion and muscle strengthening exercises prescribed by your doctor. Safety measures are also important to protect the knee from further injury. If any of these exercises cause you to have increased pain or swelling in your knee joint, decrease the amount until you are comfortable again and slowly increase them. If you have problems or questions, call your caregiver or physical therapist for advice.   BLOOD CLOT PREVENTION Take an 81 mg Aspirin two times a day for three weeks following surgery. Then take an 81 mg Aspirin once a day for three weeks. Then discontinue Aspirin. You may resume your vitamins/supplements upon discharge from the hospital. Do not take any NSAIDs (Advil, Aleve, Ibuprofen, Meloxicam, etc.) until you are 3 weeks out from surgery.  HOME CARE INSTRUCTIONS  Remove items at home which could result in a fall. This includes throw rugs or furniture in walking pathways.  ICE to the affected knee as much as tolerated. Icing helps control swelling. If the swelling is well controlled you will be more comfortable and rehab easier. Continue to use ice on the knee for pain and swelling from surgery. You may notice swelling that will progress down to the foot and ankle. This is normal after surgery. Elevate the leg when you are not up walking on it.    Continue to use the breathing machine which will help keep your temperature down. It is common for your temperature to cycle up and down following surgery, especially at night when you are not up moving around and exerting yourself. The breathing machine keeps your lungs expanded and your temperature down. Do  not place pillow under the operative knee, focus on keeping the knee straight while resting  DIET You may resume your previous home diet once you are discharged from the hospital.  DRESSING / WOUND CARE / SHOWERING Keep your bulky bandage on for 2 days. On the third post-operative day you may remove the Ace bandage and gauze. There is a waterproof adhesive bandage on your skin which will stay in place until your first follow-up appointment. Once you remove this you will not need to place another bandage You may begin showering 3 days following surgery, but do not submerge the incision under water.  ACTIVITY For the first 5 days, the key is rest and control of pain and swelling Do your home exercises twice a day starting on post-operative day 3. On the days you go to physical therapy, just do the home exercises once that day. You should rest, ice and elevate the leg for 50 minutes out of every hour. Get up and walk/stretch for 10 minutes per hour. After 5 days you can increase your activity slowly as tolerated. Walk with your walker as instructed. Use the walker until you are comfortable transitioning to a cane. Walk with the cane in the opposite hand of the operative leg. You may discontinue the cane once you are comfortable and walking steadily. Avoid periods of inactivity such as sitting longer than an hour when not asleep. This helps prevent blood clots.  You may discontinue the knee immobilizer once you are able to perform a straight leg raise while lying down. You may resume a sexual relationship in one month  or when given the OK by your doctor.  You may return to work once you are cleared by your doctor.  Do not drive a car for 6 weeks or until released by your surgeon.  Do not drive while taking narcotics.  TED HOSE STOCKINGS Wear the elastic stockings on both legs for three weeks following surgery during the day. You may remove them at night for sleeping.  WEIGHT BEARING Weight  bearing as tolerated with assist device (walker, cane, etc) as directed, use it as long as suggested by your surgeon or therapist, typically at least 4-6 weeks.  POSTOPERATIVE CONSTIPATION PROTOCOL Constipation - defined medically as fewer than three stools per week and severe constipation as less than one stool per week.  One of the most common issues patients have following surgery is constipation.  Even if you have a regular bowel pattern at home, your normal regimen is likely to be disrupted due to multiple reasons following surgery.  Combination of anesthesia, postoperative narcotics, change in appetite and fluid intake all can affect your bowels.  In order to avoid complications following surgery, here are some recommendations in order to help you during your recovery period.  Colace (docusate) - Pick up an over-the-counter form of Colace or another stool softener and take twice a day as long as you are requiring postoperative pain medications.  Take with a full glass of water daily.  If you experience loose stools or diarrhea, hold the colace until you stool forms back up. If your symptoms do not get better within 1 week or if they get worse, check with your doctor. Dulcolax (bisacodyl) - Pick up over-the-counter and take as directed by the product packaging as needed to assist with the movement of your bowels.  Take with a full glass of water.  Use this product as needed if not relieved by Colace only.  MiraLax (polyethylene glycol) - Pick up over-the-counter to have on hand. MiraLax is a solution that will increase the amount of water in your bowels to assist with bowel movements.  Take as directed and can mix with a glass of water, juice, soda, coffee, or tea. Take if you go more than two days without a movement. Do not use MiraLax more than once per day. Call your doctor if you are still constipated or irregular after using this medication for 7 days in a row.  If you continue to have problems  with postoperative constipation, please contact the office for further assistance and recommendations.  If you experience "the worst abdominal pain ever" or develop nausea or vomiting, please contact the office immediatly for further recommendations for treatment.  ITCHING If you experience itching with your medications, try taking only a single pain pill, or even half a pain pill at a time.  You can also use Benadryl over the counter for itching or also to help with sleep.   MEDICATIONS See your medication summary on the "After Visit Summary" that the nursing staff will review with you prior to discharge.  You may have some home medications which will be placed on hold until you complete the course of blood thinner medication.  It is important for you to complete the blood thinner medication as prescribed by your surgeon.  Continue your approved medications as instructed at time of discharge.  PRECAUTIONS If you experience chest pain or shortness of breath - call 911 immediately for transfer to the hospital emergency department.  If you develop a fever greater that 101 F,  purulent drainage from wound, increased redness or drainage from wound, foul odor from the wound/dressing, or calf pain - CONTACT YOUR SURGEON.                                                   FOLLOW-UP APPOINTMENTS Make sure you keep all of your appointments after your operation with your surgeon and caregivers. You should call the office at the above phone number and make an appointment for approximately two weeks after the date of your surgery or on the date instructed by your surgeon outlined in the "After Visit Summary".  RANGE OF MOTION AND STRENGTHENING EXERCISES  Rehabilitation of the knee is important following a knee injury or an operation. After just a few days of immobilization, the muscles of the thigh which control the knee become weakened and shrink (atrophy). Knee exercises are designed to build up the tone and  strength of the thigh muscles and to improve knee motion. Often times heat used for twenty to thirty minutes before working out will loosen up your tissues and help with improving the range of motion but do not use heat for the first two weeks following surgery. These exercises can be done on a training (exercise) mat, on the floor, on a table or on a bed. Use what ever works the best and is most comfortable for you Knee exercises include:  Leg Lifts - While your knee is still immobilized in a splint or cast, you can do straight leg raises. Lift the leg to 60 degrees, hold for 3 sec, and slowly lower the leg. Repeat 10-20 times 2-3 times daily. Perform this exercise against resistance later as your knee gets better.  Quad and Hamstring Sets - Tighten up the muscle on the front of the thigh (Quad) and hold for 5-10 sec. Repeat this 10-20 times hourly. Hamstring sets are done by pushing the foot backward against an object and holding for 5-10 sec. Repeat as with quad sets.  Leg Slides: Lying on your back, slowly slide your foot toward your buttocks, bending your knee up off the floor (only go as far as is comfortable). Then slowly slide your foot back down until your leg is flat on the floor again. Angel Wings: Lying on your back spread your legs to the side as far apart as you can without causing discomfort.  A rehabilitation program following serious knee injuries can speed recovery and prevent re-injury in the future due to weakened muscles. Contact your doctor or a physical therapist for more information on knee rehabilitation.   POST-OPERATIVE OPIOID TAPER INSTRUCTIONS: It is important to wean off of your opioid medication as soon as possible. If you do not need pain medication after your surgery it is ok to stop day one. Opioids include: Codeine, Hydrocodone(Norco, Vicodin), Oxycodone(Percocet, oxycontin) and hydromorphone amongst others.  Long term and even short term use of opiods can  cause: Increased pain response Dependence Constipation Depression Respiratory depression And more.  Withdrawal symptoms can include Flu like symptoms Nausea, vomiting And more Techniques to manage these symptoms Hydrate well Eat regular healthy meals Stay active Use relaxation techniques(deep breathing, meditating, yoga) Do Not substitute Alcohol to help with tapering If you have been on opioids for less than two weeks and do not have pain than it is ok to stop all together.  Plan  to wean off of opioids This plan should start within one week post op of your joint replacement. Maintain the same interval or time between taking each dose and first decrease the dose.  Cut the total daily intake of opioids by one tablet each day Next start to increase the time between doses. The last dose that should be eliminated is the evening dose.   IF YOU ARE TRANSFERRED TO A SKILLED REHAB FACILITY If the patient is transferred to a skilled rehab facility following release from the hospital, a list of the current medications will be sent to the facility for the patient to continue.  When discharged from the skilled rehab facility, please have the facility set up the patient's Home Health Physical Therapy prior to being released. Also, the skilled facility will be responsible for providing the patient with their medications at time of release from the facility to include their pain medication, the muscle relaxants, and their blood thinner medication. If the patient is still at the rehab facility at time of the two week follow up appointment, the skilled rehab facility will also need to assist the patient in arranging follow up appointment in our office and any transportation needs.  MAKE SURE YOU:  Understand these instructions.  Get help right away if you are not doing well or get worse.   DENTAL ANTIBIOTICS:  In most cases prophylactic antibiotics for Dental procdeures after total joint surgery are  not necessary.  Exceptions are as follows:  1. History of prior total joint infection  2. Severely immunocompromised (Organ Transplant, cancer chemotherapy, Rheumatoid biologic meds such as Humera)  3. Poorly controlled diabetes (A1C &gt; 8.0, blood glucose over 200)  If you have one of these conditions, contact your surgeon for an antibiotic prescription, prior to your dental procedure.    Pick up stool softner and laxative for home use following surgery while on pain medications. Do not submerge incision under water. Please use good hand washing techniques while changing dressing each day. May shower starting three days after surgery. Please use a clean towel to pat the incision dry following showers. Continue to use ice for pain and swelling after surgery. Do not use any lotions or creams on the incision until instructed by your surgeon.  

## 2023-09-15 NOTE — Transfer of Care (Signed)
 Immediate Anesthesia Transfer of Care Note  Patient: [name]  [name]  Procedure(s) Performed: ARTHROPLASTY, KNEE, TOTAL (Right: Knee)  Patient Location: PACU  Anesthesia Type:General  Level of Consciousness: awake and patient cooperative  Airway & Oxygen Therapy: Patient Spontanous Breathing and Patient connected to face mask  Post-op Assessment: Report given to RN, Post -op Vital signs reviewed and stable, and Patient moving all extremities  Post vital signs: Reviewed and stable  Last Vitals:  Vitals Value Taken Time  BP 160/107 09/15/23 0954  Temp    Pulse 89 09/15/23 0955  Resp 20 09/15/23 0955  SpO2 100 % 09/15/23 0955  Vitals shown include unfiled device data.  Last Pain:  Vitals:   09/15/23 0807  TempSrc:   PainSc: 0-No pain         Complications: No notable events documented.

## 2023-09-15 NOTE — Anesthesia Procedure Notes (Signed)
 Procedure Name: LMA Insertion Date/Time: 09/15/2023 8:48 AM  Performed by: Nola Battiest, CRNAPre-anesthesia Checklist: Patient identified, Emergency Drugs available, Suction available and Patient being monitored Patient Re-evaluated:Patient Re-evaluated prior to induction Oxygen Delivery Method: Circle System Utilized Preoxygenation: Pre-oxygenation with 100% oxygen Induction Type: IV induction Ventilation: Mask ventilation without difficulty LMA: LMA with gastric port inserted LMA Size: 4.0 Number of attempts: 1 Airway Equipment and Method: Bite block Placement Confirmation: positive ETCO2 Tube secured with: Tape Dental Injury: Teeth and Oropharynx as per pre-operative assessment

## 2023-09-15 NOTE — Interval H&P Note (Signed)
 History and Physical Interval Note:  09/15/2023 6:58 AM  [name]  has presented today for surgery, with the diagnosis of Right Knee Osteoarthritis.  The various methods of treatment have been discussed with the patient and family. After consideration of risks, benefits and other options for treatment, the patient has consented to  Procedure(s): ARTHROPLASTY, KNEE, TOTAL (Right) as a surgical intervention.  The patient's history has been reviewed, patient examined, no change in status, stable for surgery.  I have reviewed the patient's chart and labs.  Questions were answered to the patient's satisfaction.     Samuel Crock Alphonza Tramell

## 2023-09-15 NOTE — Anesthesia Procedure Notes (Signed)
 Anesthesia Regional Block: Adductor canal block   Pre-Anesthetic Checklist: , timeout performed,  Correct Patient, Correct Site, Correct Laterality,  Correct Procedure, Correct Position, site marked,  Risks and benefits discussed,  Surgical consent,  Pre-op evaluation,  At surgeon's request and post-op pain management  Laterality: Right  Prep: chloraprep       Needles:  Injection technique: Single-shot  Needle Type: Stimiplex     Needle Length: 9cm  Needle Gauge: 21     Additional Needles:   Procedures:,,,, ultrasound used (permanent image in chart),,    Narrative:  Start time: 09/15/2023 8:04 AM End time: 09/15/2023 8:09 AM Injection made incrementally with aspirations every 5 mL.  Performed by: Personally  Anesthesiologist: Earvin Goldberg, MD

## 2023-09-16 ENCOUNTER — Encounter (HOSPITAL_COMMUNITY): Payer: Self-pay | Admitting: Orthopedic Surgery

## 2023-09-16 ENCOUNTER — Encounter: Payer: Self-pay | Admitting: Oncology

## 2023-09-16 ENCOUNTER — Other Ambulatory Visit (HOSPITAL_COMMUNITY): Payer: Self-pay

## 2023-09-16 DIAGNOSIS — M1711 Unilateral primary osteoarthritis, right knee: Secondary | ICD-10-CM | POA: Diagnosis not present

## 2023-09-16 LAB — BASIC METABOLIC PANEL WITH GFR
Anion gap: 8 (ref 5–15)
BUN: 16 mg/dL (ref 8–23)
CO2: 23 mmol/L (ref 22–32)
Calcium: 7.8 mg/dL — ABNORMAL LOW (ref 8.9–10.3)
Chloride: 103 mmol/L (ref 98–111)
Creatinine, Ser: 0.46 mg/dL (ref 0.44–1.00)
GFR, Estimated: >60 mL/min (ref >60)
Glucose, Bld: 116 mg/dL — ABNORMAL HIGH (ref 70–99)
Potassium: 3.8 mmol/L (ref 3.5–5.1)
Sodium: 134 mmol/L — ABNORMAL LOW (ref 135–145)

## 2023-09-16 LAB — CBC
HCT: 31.3 % — ABNORMAL LOW (ref 36.0–46.0)
Hemoglobin: 10.3 g/dL — ABNORMAL LOW (ref 12.0–15.0)
MCH: 33.6 pg (ref 26.0–34.0)
MCHC: 32.9 g/dL (ref 30.0–36.0)
MCV: 102 fL — ABNORMAL HIGH (ref 80.0–100.0)
Platelets: 111 10*3/uL — ABNORMAL LOW (ref 150–400)
RBC: 3.07 MIL/uL — ABNORMAL LOW (ref 3.87–5.11)
RDW: 13.2 % (ref 11.5–15.5)
WBC: 8.1 10*3/uL (ref 4.0–10.5)
nRBC: 0 % (ref 0.0–0.2)

## 2023-09-16 MED ORDER — OXYCODONE HCL 5 MG PO TABS
5.0000 mg | ORAL_TABLET | Freq: Four times a day (QID) | ORAL | 0 refills | Status: AC | PRN
  Filled 2023-09-16: qty 42, 6d supply, fill #0

## 2023-09-16 MED ORDER — ASPIRIN 81 MG PO CHEW
81.0000 mg | CHEWABLE_TABLET | Freq: Two times a day (BID) | ORAL | 0 refills | Status: AC
Start: 2023-09-16 — End: 2023-10-07
  Filled 2023-09-16: qty 63, 21d supply, fill #0

## 2023-09-16 MED ORDER — TRAMADOL HCL 50 MG PO TABS
50.0000 mg | ORAL_TABLET | Freq: Four times a day (QID) | ORAL | 0 refills | Status: AC | PRN
  Filled 2023-09-16: qty 40, 5d supply, fill #0

## 2023-09-16 MED ORDER — HYDROCHLOROTHIAZIDE 12.5 MG PO TABS
12.5000 mg | ORAL_TABLET | Freq: Every day | ORAL | Status: DC
  Administered 2023-09-16: 12.5 mg via ORAL
  Filled 2023-09-16: qty 1

## 2023-09-16 MED ORDER — METHOCARBAMOL 500 MG PO TABS
500.0000 mg | ORAL_TABLET | Freq: Four times a day (QID) | ORAL | 0 refills | Status: AC | PRN
  Filled 2023-09-16: qty 40, 10d supply, fill #0

## 2023-09-16 MED ORDER — ONDANSETRON HCL 4 MG PO TABS
4.0000 mg | ORAL_TABLET | Freq: Four times a day (QID) | ORAL | 0 refills | Status: AC | PRN
  Filled 2023-09-16: qty 20, 5d supply, fill #0

## 2023-09-16 NOTE — Progress Notes (Signed)
   Subjective: 1 Day Post-Op Procedure(s) (LRB): ARTHROPLASTY, KNEE, TOTAL (Right) Patient reports pain as mild.   Patient seen in rounds by Dr. France Ina. Patient had increased pain yesterday, improved this morning. Denies chest pain, SOB, or calf pain. Voiding without difficulty We will continue therapy today, ambulated 30' yesterday.   Objective: Vital signs in last 24 hours: Temp:  [97.6 F (36.4 C)-98.2 F (36.8 C)] 98.2 F (36.8 C) (06/10 0605) Pulse Rate:  [64-94] 74 (06/10 0605) Resp:  [13-21] 17 (06/10 0605) BP: (87-170)/(53-107) 105/67 (06/10 0605) SpO2:  [94 %-99 %] 97 % (06/10 0605)  Intake/Output from previous day:  Intake/Output Summary (Last 24 hours) at 09/16/2023 0844 Last data filed at 09/16/2023 0200 Gross per 24 hour  Intake 2127.91 ml  Output 255 ml  Net 1872.91 ml     Intake/Output this shift: No intake/output data recorded.  Labs: Recent Labs    09/16/23 0337  HGB 10.3*   Recent Labs    09/16/23 0337  WBC 8.1  RBC 3.07*  HCT 31.3*  PLT 111*   Recent Labs    09/16/23 0337  NA 134*  K 3.8  CL 103  CO2 23  BUN 16  CREATININE 0.46  GLUCOSE 116*  CALCIUM 7.8*   No results for input(s): "LABPT", "INR" in the last 72 hours.  Exam: General - Patient is Alert and Oriented Extremity - Neurologically intact Neurovascular intact Sensation intact distally Dorsiflexion/Plantar flexion intact Dressing - dressing C/D/I Motor Function - intact, moving foot and toes well on exam.   Past Medical History:  Diagnosis Date   Alcoholism (HCC)    Anemia    Anxiety    Aortic stenosis    Arthritis    Asthma    Depression    GERD (gastroesophageal reflux disease)    Headache    Heart murmur    Hypertension     Assessment/Plan: 1 Day Post-Op Procedure(s) (LRB): ARTHROPLASTY, KNEE, TOTAL (Right) Principal Problem:   OA (osteoarthritis) of knee Active Problems:   Primary osteoarthritis of right knee  Estimated body mass index is 24.3  kg/m as calculated from the following:   Height as of this encounter: 5\' 5"  (1.651 m).   Weight as of this encounter: 66.2 kg. Advance diet Up with therapy D/C IV fluids   Patient's anticipated LOS is less than 2 midnights, meeting these requirements: - Lives within 1 hour of care - Has a competent adult at home to recover with post-op recover - NO history of  - Chronic pain requiring opioids  - Diabetes  - Coronary Artery Disease  - Heart failure  - Heart attack  - Stroke  - DVT/VTE  - Cardiac arrhythmia  - Respiratory Failure/COPD  - Renal failure  - Anemia  - Advanced Liver disease   DVT Prophylaxis - Aspirin Weight bearing as tolerated. Continue therapy.  Plan is to go Home after hospital stay. Plan for discharge later today if progresses with therapy and meeting goals. Scheduled for OPPT at [facility]). Follow-up in the office in 2 weeks.  The PDMP database was reviewed today prior to any opioid medications being prescribed to this patient.  Sharlynn Dear, PA-C Orthopedic Surgery [phone] 09/16/2023, 8:44 AM

## 2023-09-16 NOTE — Progress Notes (Signed)
 Discharge medications delivered to patient at bedside [facility]

## 2023-09-16 NOTE — Plan of Care (Signed)
  Problem: Coping: Goal: Level of anxiety will decrease 09/16/2023 0228 by Beola Brazil, RN Outcome: Progressing 09/16/2023 0202 by Beola Brazil, RN Outcome: Progressing   Problem: Elimination: Goal: Will not experience complications related to bowel motility 09/16/2023 0228 by Beola Brazil, RN Outcome: Progressing 09/16/2023 0202 by Beola Brazil, RN Outcome: Progressing Goal: Will not experience complications related to urinary retention 09/16/2023 0228 by Beola Brazil, RN Outcome: Progressing 09/16/2023 0202 by Beola Brazil, RN Outcome: Progressing

## 2023-09-16 NOTE — Progress Notes (Signed)
 Patient refusing bed alarm and also wants bed kept in a higher position due to phobia of being too close to the ground, advised against but patient is adamant, daughter at bedside and has been assisting patient with ambulation, patient's foley was also removed on dayshift per patient request, she has been voiding large amounts frequently this shift, will continue to monitor for pain needs and safety.

## 2023-09-16 NOTE — TOC Transition Note (Signed)
 Transition of Care [facility]) - Discharge Note   Patient Details  Name: [name]  [name] MRN: [id number] Date of Birth: May 11, 1955  Transition of Care [facility]) CM/SW Contact:  Delilah Fend, LCSW Phone Number: 09/16/2023, 10:13 AM   Clinical Narrative:     Met with pt and confirming she has received RW to room via [employer].  OPPT already arranged with [facility] ([facility]).  No further TOC needs.  Final next level of care: OP Rehab Barriers to Discharge: No Barriers Identified   Patient Goals and CMS Choice Patient states their goals for this hospitalization and ongoing recovery are:: return home          Discharge Placement                       Discharge Plan and Services Additional resources added to the After Visit Summary for                  DME Arranged: Walker rolling DME Agency: [employer]                  Social Drivers of Health (SDOH) Interventions SDOH Screenings   Food Insecurity: No Food Insecurity (09/15/2023)  Housing: Low Risk  (09/15/2023)  Transportation Needs: No Transportation Needs (09/15/2023)  Utilities: Not At Risk (09/15/2023)  Social Connections: Moderately Isolated (09/15/2023)  Tobacco Use: High Risk (09/15/2023)     Readmission Risk Interventions     No data to display

## 2023-09-16 NOTE — Progress Notes (Signed)
 Physical Therapy Treatment Patient Details Name: [name]  [name] MRN: [id number] DOB: 12/08/1955 Today's Date: 09/16/2023   History of Present Illness 68 yo female s/p R TKA on 09/15/23. PMH: tobacco use, ETOH abuse, anemia, anxiety, depression, HTN    PT Comments  Pt progressing very well. Meeting PT goals and is ready to d/c from PT standpoint, dtr present for session and will be assisting as needed.     If plan is discharge home, recommend the following: A little help with walking and/or transfers;A little help with bathing/dressing/bathroom;Help with stairs or ramp for entrance;Assist for transportation   Can travel by private vehicle        Equipment Recommendations  Rolling walker (2 wheels)    Recommendations for Other Services       Precautions / Restrictions Precautions Precautions: Fall;Knee Precaution Booklet Issued: No Recall of Precautions/Restrictions: Intact Restrictions Weight Bearing Restrictions Per Provider Order: No RLE Weight Bearing Per Provider Order: Weight bearing as tolerated     Mobility  Bed Mobility Overal bed mobility: Needs Assistance Bed Mobility: Supine to Sit, Sit to Supine     Supine to sit: Supervision Sit to supine: Supervision   General bed mobility comments: for safety    Transfers Overall transfer level: Needs assistance Equipment used: Rolling walker (2 wheels) Transfers: Sit to/from Stand Sit to Stand: Contact guard assist, Supervision           General transfer comment: cues for hand placement    Ambulation/Gait Ambulation/Gait assistance: Supervision Gait Distance (Feet): 80 Feet Assistive device: Rolling walker (2 wheels) Gait Pattern/deviations: Step-to pattern, Step-through pattern       General Gait Details: ongoing cues for sequence and RW safety. pt demonstrates carryover from prior session   Stairs Stairs: Yes Stairs assistance: Contact guard assist, Min assist Stair Management: One rail Right, Step  to pattern, Sideways Number of Stairs: 3 General stair comments: cues for sequence and technique. dtr present. good stability, no LOB   Wheelchair Mobility     Tilt Bed    Modified Rankin (Stroke Patients Only)       Balance                                            Communication Communication Communication: No apparent difficulties  Cognition Arousal: Alert Behavior During Therapy: WFL for tasks assessed/performed   PT - Cognitive impairments: No apparent impairments                         Following commands: Intact      Cueing Cueing Techniques: Verbal cues  Exercises Total Joint Exercises Ankle Circles/Pumps: AROM, Both, 10 reps Quad Sets: AROM, Both, 10 reps Heel Slides: AAROM, Right, 10 reps Straight Leg Raises: AROM, AAROM, Right, 10 reps    General Comments        Pertinent Vitals/Pain Pain Assessment Pain Assessment: 0-10 Pain Score: 4  Pain Location: right knee Pain Descriptors / Indicators: Aching, Sore Pain Intervention(s): Monitored during session, Limited activity within patient's tolerance, Premedicated before session, Repositioned    Home Living                          Prior Function            PT Goals (current goals can now be found in the care plan  section) Acute Rehab PT Goals PT Goal Formulation: With patient Time For Goal Achievement: 09/22/23 Potential to Achieve Goals: Good Progress towards PT goals: Progressing toward goals    Frequency    7X/week      PT Plan      Co-evaluation              AM-PAC PT "6 Clicks" Mobility   Outcome Measure  Help needed turning from your back to your side while in a flat bed without using bedrails?: None Help needed moving from lying on your back to sitting on the side of a flat bed without using bedrails?: A Little Help needed moving to and from a bed to a chair (including a wheelchair)?: A Little Help needed standing up from a  chair using your arms (e.g., wheelchair or bedside chair)?: A Little Help needed to walk in hospital room?: A Little Help needed climbing 3-5 steps with a railing? : A Little 6 Click Score: 19    End of Session Equipment Utilized During Treatment: Gait belt Activity Tolerance: Patient tolerated treatment well Patient left: with call bell/phone within reach;in bed;with bed alarm set   PT Visit Diagnosis: Other abnormalities of gait and mobility (R26.89);Difficulty in walking, not elsewhere classified (R26.2)     Time: 1610-9604 PT Time Calculation (min) (ACUTE ONLY): 24 min  Charges:    $Gait Training: 8-22 mins $Therapeutic Exercise: 8-22 mins PT General Charges $$ ACUTE PT VISIT: 1 Visit                     Rashena Dowling, PT  Acute Rehab Dept [facility]) [phone]  09/16/2023    [facility] 09/16/2023, 11:24 AM

## 2023-09-16 NOTE — Plan of Care (Signed)

## 2023-09-16 NOTE — Plan of Care (Signed)
  Problem: Health Behavior/Discharge Planning: Goal: Ability to manage health-related needs will improve Outcome: Adequate for Discharge   Problem: Clinical Measurements: Goal: Ability to maintain clinical measurements within normal limits will improve Outcome: Adequate for Discharge Goal: Will remain free from infection Outcome: Adequate for Discharge Goal: Diagnostic test results will improve Outcome: Adequate for Discharge Goal: Respiratory complications will improve Outcome: Adequate for Discharge Goal: Cardiovascular complication will be avoided Outcome: Adequate for Discharge   Problem: Activity: Goal: Risk for activity intolerance will decrease Outcome: Adequate for Discharge   

## 2023-09-22 NOTE — Discharge Summary (Signed)
 Patient ID: [name]  [name] MRN: [id number] DOB/AGE: 69-04-1955 69 y.o.  Admit date: 09/15/2023 Discharge date: 09/16/2023  Admission Diagnoses:  Principal Problem:   OA (osteoarthritis) of knee Active Problems:   Primary osteoarthritis of right knee   Discharge Diagnoses:  Same  Past Medical History:  Diagnosis Date   Alcoholism (HCC)    Anemia    Anxiety    Aortic stenosis    Arthritis    Asthma    Depression    GERD (gastroesophageal reflux disease)    Headache    Heart murmur    Hypertension     Surgeries: Procedure(s): ARTHROPLASTY, KNEE, TOTAL on 09/15/2023   Consultants:   Discharged Condition: Improved  Hospital Course: [name]  [name] is an 68 y.o. female who was admitted 09/15/2023 for operative treatment ofOA (osteoarthritis) of knee. Patient has severe unremitting pain that affects sleep, daily activities, and work/hobbies. After pre-op clearance the patient was taken to the operating room on 09/15/2023 and underwent  Procedure(s): ARTHROPLASTY, KNEE, TOTAL.    Patient was given perioperative antibiotics:  Anti-infectives (From admission, onward)    Start     Dose/Rate Route Frequency Ordered Stop   09/15/23 1430  ceFAZolin  (ANCEF ) IVPB 2g/100 mL premix        2 g 200 mL/hr over 30 Minutes Intravenous Every 6 hours 09/15/23 1115 09/16/23 0811   09/15/23 0630  ceFAZolin  (ANCEF ) IVPB 2g/100 mL premix        2 g 200 mL/hr over 30 Minutes Intravenous On call to O.R. 09/15/23 4098 09/15/23 0833   09/15/23 0629  vancomycin  (VANCOCIN ) IVPB 1000 mg/200 mL premix        1,000 mg 200 mL/hr over 60 Minutes Intravenous 60 min pre-op 09/15/23 0629 09/15/23 0910        Patient was given sequential compression devices, early ambulation, and chemoprophylaxis to prevent DVT.  Patient benefited maximally from hospital stay and there were no complications.    Recent vital signs: No data found.   Recent laboratory studies: No results for input(s): WBC, HGB, HCT,  PLT, NA, K, CL, CO2, BUN, CREATININE, GLUCOSE, INR, CALCIUM in the last 72 hours.  Invalid input(s): PT, 2   Discharge Medications:   Allergies as of 09/16/2023       Reactions   Morphine Nausea And Vomiting   Statins    Affected muscles, couldn't move        Medication List     STOP taking these medications    meloxicam 15 MG tablet Commonly known as: MOBIC   prednisoLONE acetate 1 % ophthalmic suspension Commonly known as: PRED FORTE       TAKE these medications    acetaminophen  500 MG tablet Commonly known as: TYLENOL  Take 1,000 mg by mouth 2 (two) times daily.   ADULT GUMMY PO Take 2 capsules by mouth daily.   albuterol  108 (90 Base) MCG/ACT inhaler Commonly known as: VENTOLIN  HFA Inhale 2 puffs into the lungs every 6 (six) hours as needed for wheezing or shortness of breath.   alendronate 70 MG tablet Commonly known as: FOSAMAX Take 70 mg by mouth once a week.   Aspirin  Low Dose 81 MG chewable tablet Generic drug: aspirin  Chew 1 tablet (81 mg total) by mouth 2 (two) times daily for 21 days. Then discontinue aspirin .   chlorhexidine  4 % external liquid Commonly known as: HIBICLENS  Apply 15 mLs (1 Application total) topically as directed for 30 doses. Use as directed daily for 5 days every other week for  6 weeks.   cyanocobalamin  500 MCG tablet Commonly known as: VITAMIN B12 Take 1,000 mcg by mouth daily.   fexofenadine 180 MG tablet Commonly known as: ALLEGRA Take 180 mg by mouth daily.   fluticasone 50 MCG/ACT nasal spray Commonly known as: FLONASE Place 1 spray into both nostrils daily.   gabapentin  300 MG capsule Commonly known as: NEURONTIN  Take 900 mg by mouth at bedtime.   gentamicin 0.3 % ophthalmic solution Commonly known as: GARAMYCIN Place 1 drop into the left eye every 6 (six) hours.   hydrochlorothiazide  12.5 MG capsule Commonly known as: MICROZIDE  Take 12.5 mg by mouth daily.   LORazepam  1 MG  tablet Commonly known as: ATIVAN  Take 1 mg by mouth at bedtime.   methocarbamol  500 MG tablet Commonly known as: ROBAXIN  Take 1 tablet (500 mg total) by mouth every 6 (six) hours as needed for muscle spasms.   mupirocin  ointment 2 % Commonly known as: BACTROBAN  Place 1 Application into the nose 2 (two) times daily for 60 doses. Use as directed 2 times daily for 5 days every other week for 6 weeks.   omeprazole  40 MG capsule Commonly known as: PRILOSEC Take 1 capsule (40 mg total) by mouth 2 (two) times daily. What changed: when to take this   ondansetron  4 MG tablet Commonly known as: ZOFRAN  Take 1 tablet (4 mg total) by mouth every 6 (six) hours as needed for nausea.   oxyCODONE  5 MG immediate release tablet Commonly known as: Oxy IR/ROXICODONE  Take 1-2 tablets (5-10 mg total) by mouth every 6 (six) hours as needed for severe pain (pain score 7-10).   traMADol  50 MG tablet Commonly known as: ULTRAM  Take 1-2 tablets (50-100 mg total) by mouth every 6 (six) hours as needed for moderate pain (pain score 4-6).   traZODone  100 MG tablet Commonly known as: DESYREL  Take 100 mg by mouth at bedtime.               Discharge Care Instructions  (From admission, onward)           Start     Ordered   09/16/23 0000  Weight bearing as tolerated        09/16/23 0846   09/16/23 0000  Change dressing       Comments: You may remove the bulky bandage (ACE wrap and gauze) two days after surgery. You will have an adhesive waterproof bandage underneath. Leave this in place until your first follow-up appointment.   09/16/23 0846            Diagnostic Studies: No results found.  Disposition: Discharge disposition: 01-Home or Self Care       Discharge Instructions     Call MD / Call 911   Complete by: As directed    If you experience chest pain or shortness of breath, CALL 911 and be transported to the hospital emergency room.  If you develope a fever above 101 F, pus  (white drainage) or increased drainage or redness at the wound, or calf pain, call your surgeon's office.   Change dressing   Complete by: As directed    You may remove the bulky bandage (ACE wrap and gauze) two days after surgery. You will have an adhesive waterproof bandage underneath. Leave this in place until your first follow-up appointment.   Constipation Prevention   Complete by: As directed    Drink plenty of fluids.  Prune juice may be helpful.  You may use a stool softener,  such as Colace (over the counter) 100 mg twice a day.  Use MiraLax  (over the counter) for constipation as needed.   Diet - low sodium heart healthy   Complete by: As directed    Do not put a pillow under the knee. Place it under the heel.   Complete by: As directed    Driving restrictions   Complete by: As directed    No driving for two weeks   Post-operative opioid taper instructions:   Complete by: As directed    POST-OPERATIVE OPIOID TAPER INSTRUCTIONS: It is important to wean off of your opioid medication as soon as possible. If you do not need pain medication after your surgery it is ok to stop day one. Opioids include: Codeine, Hydrocodone(Norco, Vicodin), Oxycodone (Percocet, oxycontin ) and hydromorphone  amongst others.  Long term and even short term use of opiods can cause: Increased pain response Dependence Constipation Depression Respiratory depression And more.  Withdrawal symptoms can include Flu like symptoms Nausea, vomiting And more Techniques to manage these symptoms Hydrate well Eat regular healthy meals Stay active Use relaxation techniques(deep breathing, meditating, yoga) Do Not substitute Alcohol  to help with tapering If you have been on opioids for less than two weeks and do not have pain than it is ok to stop all together.  Plan to wean off of opioids This plan should start within one week post op of your joint replacement. Maintain the same interval or time between taking  each dose and first decrease the dose.  Cut the total daily intake of opioids by one tablet each day Next start to increase the time between doses. The last dose that should be eliminated is the evening dose.      TED hose   Complete by: As directed    Use stockings (TED hose) for three weeks on both leg(s).  You may remove them at night for sleeping.   Weight bearing as tolerated   Complete by: As directed         Follow-up Information     Liliane Rei, MD. Go on 10/01/2023.   Specialty: Orthopedic Surgery Why: You are scheduled for a post op appointment on Wednesday 10/01/23 at 2:45pm Contact information: [address] [place] [address] [phone]         Alan All.. Go on 09/18/2023.   Why: You are scheduled for physical therapy Thursday 09/18/23 at 11:15am Contact information: [address] [place] [address] [phone]                  Signed: Sharlynn Dear 09/22/2023, 9:50 AM

## 2023-11-14 ENCOUNTER — Inpatient Hospital Stay: Payer: Medicare Other | Attending: Hematology

## 2023-11-14 DIAGNOSIS — Z79899 Other long term (current) drug therapy: Secondary | ICD-10-CM | POA: Insufficient documentation

## 2023-11-14 DIAGNOSIS — D509 Iron deficiency anemia, unspecified: Secondary | ICD-10-CM | POA: Diagnosis present

## 2023-11-14 LAB — FERRITIN: Ferritin: 66 ng/mL (ref 11–307)

## 2023-11-14 LAB — CBC WITH DIFFERENTIAL/PLATELET
Abs Granulocyte: 3.8 K/uL (ref 1.5–6.5)
Abs Immature Granulocytes: 0.01 K/uL (ref 0.00–0.07)
Basophils Absolute: 0 K/uL (ref 0.0–0.1)
Basophils Relative: 0 %
Eosinophils Absolute: 0.1 K/uL (ref 0.0–0.5)
Eosinophils Relative: 2 %
HCT: 42.2 % (ref 36.0–46.0)
Hemoglobin: 14.1 g/dL (ref 12.0–15.0)
Immature Granulocytes: 0 %
Lymphocytes Relative: 22 %
Lymphs Abs: 1.3 K/uL (ref 0.7–4.0)
MCH: 33.6 pg (ref 26.0–34.0)
MCHC: 33.4 g/dL (ref 30.0–36.0)
MCV: 100.5 fL — ABNORMAL HIGH (ref 80.0–100.0)
Monocytes Absolute: 0.6 K/uL (ref 0.1–1.0)
Monocytes Relative: 10 %
Neutro Abs: 3.8 K/uL (ref 1.7–7.7)
Neutrophils Relative %: 66 %
Platelets: 142 K/uL — ABNORMAL LOW (ref 150–400)
RBC: 4.2 MIL/uL (ref 3.87–5.11)
RDW: 13.4 % (ref 11.5–15.5)
WBC: 5.8 K/uL (ref 4.0–10.5)
nRBC: 0 % (ref 0.0–0.2)

## 2023-11-14 LAB — COMPREHENSIVE METABOLIC PANEL WITH GFR
ALT: 25 U/L (ref 0–44)
AST: 23 U/L (ref 15–41)
Albumin: 4.1 g/dL (ref 3.5–5.0)
Alkaline Phosphatase: 52 U/L (ref 38–126)
Anion gap: 11 (ref 5–15)
BUN: 19 mg/dL (ref 8–23)
CO2: 24 mmol/L (ref 22–32)
Calcium: 8.5 mg/dL — ABNORMAL LOW (ref 8.9–10.3)
Chloride: 101 mmol/L (ref 98–111)
Creatinine, Ser: 0.64 mg/dL (ref 0.44–1.00)
GFR, Estimated: >60 mL/min (ref >60)
Glucose, Bld: 119 mg/dL — ABNORMAL HIGH (ref 70–99)
Potassium: 3.8 mmol/L (ref 3.5–5.1)
Sodium: 136 mmol/L (ref 135–145)
Total Bilirubin: 0.7 mg/dL (ref 0.0–1.2)
Total Protein: 6.8 g/dL (ref 6.5–8.1)

## 2023-11-14 LAB — IRON AND TIBC
Iron: 135 ug/dL (ref 28–170)
Saturation Ratios: 37 % — ABNORMAL HIGH (ref 10.4–31.8)
TIBC: 368 ug/dL (ref 250–450)
UIBC: 233 ug/dL

## 2023-11-14 LAB — FOLATE: Folate: 12 ng/mL (ref >5.9)

## 2023-11-14 LAB — VITAMIN B12: Vitamin B-12: 972 pg/mL — ABNORMAL HIGH (ref 180–914)

## 2023-11-21 ENCOUNTER — Ambulatory Visit: Payer: Medicare Other | Admitting: Oncology

## 2023-11-28 ENCOUNTER — Inpatient Hospital Stay: Admitting: Oncology

## 2023-12-16 ENCOUNTER — Inpatient Hospital Stay: Attending: Hematology | Admitting: Oncology

## 2023-12-16 VITALS — BP 123/73 | HR 76 | Temp 97.9°F | Resp 18 | Wt 137.5 lb

## 2023-12-16 DIAGNOSIS — D7589 Other specified diseases of blood and blood-forming organs: Secondary | ICD-10-CM | POA: Diagnosis not present

## 2023-12-16 DIAGNOSIS — D509 Iron deficiency anemia, unspecified: Secondary | ICD-10-CM | POA: Diagnosis not present

## 2023-12-16 DIAGNOSIS — F17210 Nicotine dependence, cigarettes, uncomplicated: Secondary | ICD-10-CM | POA: Diagnosis not present

## 2023-12-16 DIAGNOSIS — Z720 Tobacco use: Secondary | ICD-10-CM | POA: Diagnosis not present

## 2023-12-16 DIAGNOSIS — K909 Intestinal malabsorption, unspecified: Secondary | ICD-10-CM | POA: Diagnosis not present

## 2023-12-16 DIAGNOSIS — M1711 Unilateral primary osteoarthritis, right knee: Secondary | ICD-10-CM

## 2023-12-16 NOTE — Assessment & Plan Note (Signed)
 Elevated MCV 100.5.  B12 level 972.  No MMA.  Folate normal. Continue B12 supplements for now.  Will recheck in 6 months.

## 2023-12-16 NOTE — Assessment & Plan Note (Addendum)
 Reports recent replacement of her right knee. Keep follow-up with orthopedics.  Tolerated procedure well.

## 2023-12-16 NOTE — Assessment & Plan Note (Addendum)
 Patient reports a current tobacco use: smoking 1/2 pack of cigarettes per day 35 pack year history Reports night mares with nicotine patch Reluctant to retry nicotine patch or Chantix Discussed risks of smoking elaborately including cancer and cardiac risk and recommended strongly to quit smoking

## 2023-12-16 NOTE — Progress Notes (Signed)
 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  S/p 1 g Monoferric  with improvement in iron levels and resolution of anemia.  Cannot tolerate oral iron due to constipation. -Continue healthy diet with protein and greens. -Last colonoscopy: 2022, recommended to repeat in 5 years i.e 2027. One sessile serrated polyp seen.   -Return to clinic in 6 months with labs. Tobacco use -Patient reports a current tobacco use: smoking 1/2 pack of cigarettes per day. 35 pack year history -Reports night mares with nicotine  patch. Reluctant to retry nicotine  patch or Chantix -Discussed risks of smoking elaborately including cancer and cardiac risk and recommended strongly to quit smoking  Primary osteoarthritis of right knee Reports recent replacement of her right knee. Keep follow-up with orthopedics.  Tolerated procedure well. Macrocytosis Elevated MCV 100.5.  B12 level 972.  No MMA.  Folate normal. Continue B12 supplements for now.  Will recheck in 6 months.  Orders Placed This Encounter  Procedures   Iron and TIBC    Standing Status:   Future    Expected Date:   06/14/2024    Expiration Date:   09/12/2024   CBC with Differential/Platelet    Standing Status:   Future    Expected Date:   06/14/2024    Expiration Date:   09/12/2024   Comprehensive metabolic panel    Standing Status:   Future    Expected Date:   06/14/2024    Expiration Date:   09/12/2024   Vitamin B12    Standing Status:   Future    Expected Date:   06/14/2024    Expiration Date:   09/12/2024   Folate    Standing Status:   Future    Expected Date:   06/14/2024    Expiration Date:   09/12/2024   Ferritin    Standing Status:   Future    Expected Date:   06/14/2024    Expiration Date:   09/12/2024    INTERVAL HISTORY: Patient returns for follow-up for iron  deficiency anemia.  She received 1 dose of Monoferric  on 11/29/2022 with great tolerance.  She is unable to tolerate oral iron secondary to constipation despite taking a stool softener.  Reports chronic stable fatigue.  Patient is taking B12 supplements.  Reports she did not feel much difference after her iron infusion.  She denies any bleeding, melena or hematochezia.  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  Surgery went well.  We reviewed labs from 11/14/2023.  SUMMARY OF HEMATOLOGIC HISTORY: Oncology History   No history exists.     CBC    Component Value Date/Time   WBC 5.8 11/14/2023 0753   RBC 4.20 11/14/2023 0753   HGB 14.1 11/14/2023 0753   HCT 42.2 11/14/2023 0753   PLT 142 (L) 11/14/2023 0753   MCV 100.5 (H) 11/14/2023 0753   MCH 33.6 11/14/2023 0753   MCHC 33.4 11/14/2023 0753   RDW 13.4 11/14/2023 0753   LYMPHSABS 1.3 11/14/2023 0753   MONOABS 0.6 11/14/2023 0753   EOSABS 0.1 11/14/2023 0753   BASOSABS 0.0 11/14/2023 0753       Latest Ref Rng & Units 11/14/2023    7:53 AM 09/16/2023    3:37 AM 09/08/2023    8:45 AM  CMP  Glucose 70 - 99 mg/dL 880  883  87   BUN 8 - 23 mg/dL 19  16  16    Creatinine 0.44 - 1.00 mg/dL 9.35  9.53  9.53   Sodium 135 - 145 mmol/L 136  134  137   Potassium 3.5 - 5.1 mmol/L 3.8  3.8  4.1   Chloride 98 - 111 mmol/L 101  103  105   CO2 22 - 32 mmol/L 24  23  26    Calcium 8.9 - 10.3 mg/dL 8.5  7.8  9.0   Total Protein 6.5 - 8.1 g/dL 6.8     Total Bilirubin 0.0 - 1.2 mg/dL 0.7     Alkaline Phos 38 - 126 U/L 52     AST 15 - 41 U/L 23     ALT 0 - 44 U/L 25        Lab Results  Component Value Date   FERRITIN 66 11/14/2023   VITAMINB12 972 (H) 11/14/2023    Vitals:   12/16/23 1409  BP: 123/73  Pulse: 76  Resp: 18  Temp: 97.9 F (36.6 C)  SpO2: 96%    Review of System:  Review of Systems  Constitutional:  Positive for malaise/fatigue.   Musculoskeletal:  Positive for joint pain.  Neurological:  Positive for headaches.    Physical Exam: Physical Exam Constitutional:      Appearance: Normal appearance.  HENT:     Head: Normocephalic and atraumatic.  Eyes:     Pupils: Pupils are equal, round, and reactive to light.  Cardiovascular:     Rate and Rhythm: Normal rate and regular rhythm.     Heart sounds: Normal heart sounds. No murmur heard. Pulmonary:     Effort: Pulmonary effort is normal.     Breath sounds: Normal breath sounds. No wheezing.  Abdominal:     General: Bowel sounds are normal. There is no distension.     Palpations: Abdomen is soft.     Tenderness: There is no abdominal tenderness.  Musculoskeletal:        General: Normal range of motion.     Cervical back: Normal range of motion.  Skin:    General: Skin is warm and dry.     Findings: No rash.  Neurological:     Mental Status: She is alert and oriented to person, place, and time.     Gait: Gait is intact.  Psychiatric:        Mood and Affect: Mood and affect normal.        Cognition and Memory: Memory normal.        Judgment: Judgment normal.      I spent 20 minutes dedicated to the care of this patient (face-to-face and non-face-to-face) on the date of the encounter to include what is described in the assessment and plan.,  Delon Hope, NP 12/16/2023 6:04 PM

## 2023-12-16 NOTE — Assessment & Plan Note (Addendum)
 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  S/p 1 g Monoferric with improvement in iron levels and resolution of anemia.  Cannot tolerate oral iron due to constipation. -Continue healthy diet with protein and greens. -Last colonoscopy: 2022, recommended to repeat in 5 years i.e 2027. One sessile serrated polyp seen.   -Return to clinic in 6 months with labs.

## 2024-06-08 ENCOUNTER — Inpatient Hospital Stay

## 2024-06-15 ENCOUNTER — Ambulatory Visit: Admitting: Oncology

## 2024-06-15 ENCOUNTER — Inpatient Hospital Stay: Admitting: Oncology
